# Patient Record
Sex: Female | Born: 1950
Health system: Southern US, Community
[De-identification: ages and names within clinical notes are randomized; demographics above are authoritative.]

## PROBLEM LIST (undated history)

## (undated) DIAGNOSIS — R7303 Prediabetes: Secondary | ICD-10-CM

## (undated) DIAGNOSIS — E119 Type 2 diabetes mellitus without complications: Secondary | ICD-10-CM

## (undated) DIAGNOSIS — Z780 Asymptomatic menopausal state: Secondary | ICD-10-CM

## (undated) DIAGNOSIS — Z9289 Personal history of other medical treatment: Secondary | ICD-10-CM

## (undated) DIAGNOSIS — Z8679 Personal history of other diseases of the circulatory system: Secondary | ICD-10-CM

## (undated) DIAGNOSIS — E7801 Familial hypercholesterolemia: Secondary | ICD-10-CM

## (undated) DIAGNOSIS — I10 Essential (primary) hypertension: Secondary | ICD-10-CM

## (undated) DIAGNOSIS — M8580 Other specified disorders of bone density and structure, unspecified site: Secondary | ICD-10-CM

## (undated) DIAGNOSIS — S83207D Unspecified tear of unspecified meniscus, current injury, left knee, subsequent encounter: Secondary | ICD-10-CM

## (undated) DIAGNOSIS — M810 Age-related osteoporosis without current pathological fracture: Secondary | ICD-10-CM

## (undated) HISTORY — DX: Type 2 diabetes mellitus without complications: E11.9

## (undated) HISTORY — DX: Unspecified tear of unspecified meniscus, current injury, left knee, subsequent encounter: S83.207D

## (undated) HISTORY — DX: Prediabetes: R73.03

## (undated) HISTORY — DX: Personal history of other medical treatment: Z92.89

## (undated) HISTORY — DX: Personal history of other diseases of the circulatory system: Z86.79

## (undated) HISTORY — DX: Other specified disorders of bone density and structure, unspecified site: M85.80

## (undated) HISTORY — DX: Asymptomatic menopausal state: Z78.0

## (undated) HISTORY — DX: Essential (primary) hypertension: I10

## (undated) HISTORY — DX: Familial hypercholesterolemia: E78.01

## (undated) HISTORY — PX: TUBAL LIGATION: SHX77

---
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
## 1997-12-17 HISTORY — PX: ABDOMINAL HYSTERECTOMY: SHX81

## 2016-04-24 ENCOUNTER — Ambulatory Visit: Payer: BLUE CROSS/BLUE SHIELD | Admitting: Nurse Practitioner

## 2016-04-27 ENCOUNTER — Encounter: Payer: Self-pay | Admitting: Family Medicine

## 2016-04-27 ENCOUNTER — Ambulatory Visit (INDEPENDENT_AMBULATORY_CARE_PROVIDER_SITE_OTHER): Payer: BLUE CROSS/BLUE SHIELD | Admitting: Family Medicine

## 2016-04-27 VITALS — BP 140/92 | HR 83 | Temp 98.4°F | Ht 62.5 in | Wt 157.4 lb

## 2016-04-27 DIAGNOSIS — R6889 Other general symptoms and signs: Secondary | ICD-10-CM | POA: Diagnosis not present

## 2016-04-27 DIAGNOSIS — I10 Essential (primary) hypertension: Secondary | ICD-10-CM | POA: Diagnosis not present

## 2016-04-27 DIAGNOSIS — Z0001 Encounter for general adult medical examination with abnormal findings: Secondary | ICD-10-CM | POA: Diagnosis not present

## 2016-04-27 NOTE — Patient Instructions (Signed)
It was great to see you today.  Please give [place] a call about your BP medication and we will make adjustments.  Schedule a follow up lab appt on your way out.  Be sure to get your mammogram later this year.  We will discuss follow-up once I hear back about your blood pressure medication  Take care  Dr. Adriana Simas  Health Maintenance, Female Adopting a healthy lifestyle and getting preventive care can go a long way to promote health and wellness. Talk with your health care provider about what schedule of regular examinations is right for you. This is a good chance for you to check in with your provider about disease prevention and staying healthy. In between checkups, there are plenty of things you can do on your own. Experts have done a lot of research about which lifestyle changes and preventive measures are most likely to keep you healthy. Ask your health care provider for more information. WEIGHT AND DIET  Eat a healthy diet  Be sure to include plenty of vegetables, fruits, low-fat dairy products, and lean protein.  Do not eat a lot of foods high in solid fats, added sugars, or salt.  Get regular exercise. This is one of the most important things you can do for your health.  Most adults should exercise for at least 150 minutes each week. The exercise should increase your heart rate and make you sweat (moderate-intensity exercise).  Most adults should also do strengthening exercises at least twice a week. This is in addition to the moderate-intensity exercise.  Maintain a healthy weight  Body mass index (BMI) is a measurement that can be used to identify possible weight problems. It estimates body fat based on height and weight. Your health care provider can help determine your BMI and help you achieve or maintain a healthy weight.  For females 16 years of age and older:   A BMI below 18.5 is considered underweight.  A BMI of 18.5 to 24.9 is normal.  A BMI of 25 to 29.9 is  considered overweight.  A BMI of 30 and above is considered obese.  Watch levels of cholesterol and blood lipids  You should start having your blood tested for lipids and cholesterol at 63 years of age, then have this test every 5 years.  You may need to have your cholesterol levels checked more often if:  Your lipid or cholesterol levels are high.  You are older than 63 years of age.  You are at high risk for heart disease.  CANCER SCREENING   Lung Cancer  Lung cancer screening is recommended for adults 69-8 years old who are at high risk for lung cancer because of a history of smoking.  A yearly low-dose CT scan of the lungs is recommended for people who:  Currently smoke.  Have quit within the past 15 years.  Have at least a 30-pack-year history of smoking. A pack year is smoking an average of one pack of cigarettes a day for 1 year.  Yearly screening should continue until it has been 15 years since you quit.  Yearly screening should stop if you develop a health problem that would prevent you from having lung cancer treatment.  Breast Cancer  Practice breast self-awareness. This means understanding how your breasts normally appear and feel.  It also means doing regular breast self-exams. Let your health care provider know about any changes, no matter how small.  If you are in your 20s or 30s, you should  have a clinical breast exam (CBE) by a health care provider every 1-3 years as part of a regular health exam.  If you are 45 or older, have a CBE every year. Also consider having a breast X-ray (mammogram) every year.  If you have a family history of breast cancer, talk to your health care provider about genetic screening.  If you are at high risk for breast cancer, talk to your health care provider about having an MRI and a mammogram every year.  Breast cancer gene (BRCA) assessment is recommended for women who have family members with BRCA-related cancers.  BRCA-related cancers include:  Breast.  Ovarian.  Tubal.  Peritoneal cancers.  Results of the assessment will determine the need for genetic counseling and BRCA1 and BRCA2 testing. Cervical Cancer Your health care provider may recommend that you be screened regularly for cancer of the pelvic organs (ovaries, uterus, and vagina). This screening involves a pelvic examination, including checking for microscopic changes to the surface of your cervix (Pap test). You may be encouraged to have this screening done every 3 years, beginning at age 6.  For women ages 61-65, health care providers may recommend pelvic exams and Pap testing every 3 years, or they may recommend the Pap and pelvic exam, combined with testing for human papilloma virus (HPV), every 5 years. Some types of HPV increase your risk of cervical cancer. Testing for HPV may also be done on women of any age with unclear Pap test results.  Other health care providers may not recommend any screening for nonpregnant women who are considered low risk for pelvic cancer and who do not have symptoms. Ask your health care provider if a screening pelvic exam is right for you.  If you have had past treatment for cervical cancer or a condition that could lead to cancer, you need Pap tests and screening for cancer for at least 20 years after your treatment. If Pap tests have been discontinued, your risk factors (such as having a new sexual partner) need to be reassessed to determine if screening should resume. Some women have medical problems that increase the chance of getting cervical cancer. In these cases, your health care provider may recommend more frequent screening and Pap tests. Colorectal Cancer  This type of cancer can be detected and often prevented.  Routine colorectal cancer screening usually begins at 63 years of age and continues through 63 years of age.  Your health care provider may recommend screening at an earlier age if you  have risk factors for colon cancer.  Your health care provider may also recommend using home test kits to check for hidden blood in the stool.  A small camera at the end of a tube can be used to examine your colon directly (sigmoidoscopy or colonoscopy). This is done to check for the earliest forms of colorectal cancer.  Routine screening usually begins at age 68.  Direct examination of the colon should be repeated every 5-10 years through 63 years of age. However, you may need to be screened more often if early forms of precancerous polyps or small growths are found. Skin Cancer  Check your skin from head to toe regularly.  Tell your health care provider about any new moles or changes in moles, especially if there is a change in a mole's shape or color.  Also tell your health care provider if you have a mole that is larger than the size of a pencil eraser.  Always use sunscreen. Apply  sunscreen liberally and repeatedly throughout the day.  Protect yourself by wearing long sleeves, pants, a wide-brimmed hat, and sunglasses whenever you are outside. HEART DISEASE, DIABETES, AND HIGH BLOOD PRESSURE   High blood pressure causes heart disease and increases the risk of stroke. High blood pressure is more likely to develop in:  People who have blood pressure in the high end of the normal range (130-139/85-89 mm Hg).  People who are overweight or obese.  People who are African American.  If you are 87-106 years of age, have your blood pressure checked every 3-5 years. If you are 77 years of age or older, have your blood pressure checked every year. You should have your blood pressure measured twice--once when you are at a hospital or clinic, and once when you are not at a hospital or clinic. Record the average of the two measurements. To check your blood pressure when you are not at a hospital or clinic, you can use:  An automated blood pressure machine at a pharmacy.  A home blood pressure  monitor.  If you are between 20 years and 76 years old, ask your health care provider if you should take aspirin to prevent strokes.  Have regular diabetes screenings. This involves taking a blood sample to check your fasting blood sugar level.  If you are at a normal weight and have a low risk for diabetes, have this test once every three years after 62 years of age.  If you are overweight and have a high risk for diabetes, consider being tested at a younger age or more often. PREVENTING INFECTION  Hepatitis B  If you have a higher risk for hepatitis B, you should be screened for this virus. You are considered at high risk for hepatitis B if:  You were born in a country where hepatitis B is common. Ask your health care provider which countries are considered high risk.  Your parents were born in a high-risk country, and you have not been immunized against hepatitis B (hepatitis B vaccine).  You have HIV or AIDS.  You use needles to inject street drugs.  You live with someone who has hepatitis B.  You have had sex with someone who has hepatitis B.  You get hemodialysis treatment.  You take certain medicines for conditions, including cancer, organ transplantation, and autoimmune conditions. Hepatitis C  Blood testing is recommended for:  Everyone born from [birth year] through 1965.  Anyone with known risk factors for hepatitis C. Sexually transmitted infections (STIs)  You should be screened for sexually transmitted infections (STIs) including gonorrhea and chlamydia if:  You are sexually active and are younger than 63 years of age.  You are older than 63 years of age and your health care provider tells you that you are at risk for this type of infection.  Your sexual activity has changed since you were last screened and you are at an increased risk for chlamydia or gonorrhea. Ask your health care provider if you are at risk.  If you do not have HIV, but are at risk, it may be  recommended that you take a prescription medicine daily to prevent HIV infection. This is called pre-exposure prophylaxis (PrEP). You are considered at risk if:  You are sexually active and do not regularly use condoms or know the HIV status of your partner(s).  You take drugs by injection.  You are sexually active with a partner who has HIV. Talk with your health care provider about whether  you are at high risk of being infected with HIV. If you choose to begin PrEP, you should first be tested for HIV. You should then be tested every 3 months for as long as you are taking PrEP.  PREGNANCY   If you are premenopausal and you may become pregnant, ask your health care provider about preconception counseling.  If you may become pregnant, take 400 to 800 micrograms (mcg) of folic acid every day.  If you want to prevent pregnancy, talk to your health care provider about birth control (contraception). OSTEOPOROSIS AND MENOPAUSE   Osteoporosis is a disease in which the bones lose minerals and strength with aging. This can result in serious bone fractures. Your risk for osteoporosis can be identified using a bone density scan.  If you are 25 years of age or older, or if you are at risk for osteoporosis and fractures, ask your health care provider if you should be screened.  Ask your health care provider whether you should take a calcium or vitamin D supplement to lower your risk for osteoporosis.  Menopause may have certain physical symptoms and risks.  Hormone replacement therapy may reduce some of these symptoms and risks. Talk to your health care provider about whether hormone replacement therapy is right for you.  HOME CARE INSTRUCTIONS   Schedule regular health, dental, and eye exams.  Stay current with your immunizations.   Do not use any tobacco products including cigarettes, chewing tobacco, or electronic cigarettes.  If you are pregnant, do not drink alcohol.  If you are  breastfeeding, limit how much and how often you drink alcohol.  Limit alcohol intake to no more than 1 drink per day for nonpregnant women. One drink equals 12 ounces of beer, 5 ounces of wine, or 1 ounces of hard liquor.  Do not use street drugs.  Do not share needles.  Ask your health care provider for help if you need support or information about quitting drugs.  Tell your health care provider if you often feel depressed.  Tell your health care provider if you have ever been abused or do not feel safe at home.   This information is not intended to replace advice given to you by your health care provider. Make sure you discuss any questions you have with your health care provider.   Document Released: 06/18/2011 Document Revised: 12/24/2014 Document Reviewed: 11/04/2013 Elsevier Interactive Patient Education [employer].

## 2016-04-27 NOTE — Progress Notes (Signed)
Pre visit review using our clinic review tool, if applicable. No additional management support is needed unless otherwise documented below in the visit note. 

## 2016-04-28 ENCOUNTER — Encounter: Payer: Self-pay | Admitting: Family Medicine

## 2016-04-28 DIAGNOSIS — I10 Essential (primary) hypertension: Secondary | ICD-10-CM | POA: Insufficient documentation

## 2016-04-28 DIAGNOSIS — Z0001 Encounter for general adult medical examination with abnormal findings: Secondary | ICD-10-CM | POA: Insufficient documentation

## 2016-04-28 HISTORY — DX: Essential (primary) hypertension: I10

## 2016-04-28 NOTE — Assessment & Plan Note (Signed)
Stable and right at goal. Patient to call us to inform of correct medication. Will plan to cut back to single drug to see how she will do. Advised healthy low salt diet and weight loss.

## 2016-04-28 NOTE — Progress Notes (Signed)
Subjective:  Patient ID: Christine Cruz, female    DOB: [birth year]  Age: 65 y.o. MRN: [id number]  CC: Establish care, HTN  HPI Christine Cruz is a 65 y.o. female presents to the clinic today to establish care.  Preventative Healthcare  Pap smear: Up to date. 2016.  Mammogram: Up to date. 2016.  Colonoscopy: Up to date. Done earlier in 2017.  Immunizations  Tetanus - In need of. Would like to wait on this at this time.  Pneumococcal - N/A.  Flu - Not indicated at this time of the year.  Zoster - Due for. Would like to wait.  Labs: Patient to return for fasting labs (per patient preference).  Exercise: No regular exercise.  Alcohol use: See below.  Smoking/tobacco use: Former smoker.  Regular dental exams: No.  Wears seat belt: Yes.  HTN  Stable.  Patient states that she is on HCTZ and another medication (she is unsure if it is lisinopril or losartan.  She states that she has been stable but feels that her prior medication (Benicar) did a better job.  She expresses desire to decrease and ultimately come off medication if she can.  She would like to discuss how to do this today.  PMH, Surgical Hx, Family Hx, Social History reviewed and updated as below.  Past Medical History  Diagnosis Date  . History of hypertension   . History of blood transfusion    Past Surgical History  Procedure Laterality Date  . Breast biopsy  1966  . Tonsillectomy  1965  . Abdominal hysterectomy  1999   Family History  Problem Relation Age of Onset  . Arthritis Mother   . Breast cancer Sister   . Heart disease Mother   . Stroke Mother   . Hypertension Mother   . Kidney disease Mother   . Diabetes Mother    Social History  Substance Use Topics  . Smoking status: Former Smoker    Quit date: 04/27/2006  . Smokeless tobacco: Not on file  . Alcohol Use: 0.0 oz/week    0 Standard drinks or equivalent per week     Comment: occ   Review of Systems General: Denies unexplained  weight loss, fever. Skin: Denies new or changing mole, sore/wound that won't heal. ENT: Trouble hearing, ringing in the ears, sores in the mouth, hoarseness, trouble swallowing. Eyes: Denies trouble seeing/visual disturbance. Heart/CV: Denies chest pain, shortness of breath, edema, palpitations. Lungs/Resp: Denies cough, shortness of breath, hemoptysis. Abd/GI: Denies nausea, vomiting, diarrhea, constipation, abdominal pain, hematochezia, melena. GU: Denies dysuria, incontinence, hematuria, urinary frequency, difficulty starting/keeping stream, vaginal discharge, sexual difficulty, lump in breasts. MSK: Denies joint pain/swelling, myalgias. Neuro: Denies headaches, weakness, numbness, dizziness, syncope. Psych: Denies sadness, anxiety, stress, memory difficulty. Endocrine: Denies polyuria and polydipsia.  Objective:   Today's Vitals: BP 140/92 mmHg  Pulse 83  Temp(Src) 98.4 F (36.9 C) (Oral)  Ht 5' 2.5" (1.588 m)  Wt 157 lb 6 oz (71.385 kg)  BMI 28.31 kg/m2  SpO2 97%  Physical Exam  Constitutional: She is oriented to person, place, and time. She appears well-developed and well-nourished. No distress.  HENT:  Head: Normocephalic and atraumatic.  Nose: Nose normal.  Mouth/Throat: Oropharynx is clear and moist. No oropharyngeal exudate.  Normal TM's bilaterally.   Eyes: Conjunctivae are normal. No scleral icterus.  Neck: Neck supple. No thyromegaly present.  Cardiovascular: Normal rate and regular rhythm.   No murmur heard. Pulmonary/Chest: Effort normal and breath sounds normal. She has no wheezes. She  has no rales.  Abdominal: Soft. She exhibits no distension. There is no tenderness. There is no rebound and no guarding.  Musculoskeletal: Normal range of motion. She exhibits no edema.  Lymphadenopathy:    She has no cervical adenopathy.  Neurological: She is alert and oriented to person, place, and time.  Skin: Skin is warm and dry. No rash noted.  Psychiatric: She has a  normal mood and affect.  Vitals reviewed.  Assessment & Plan:   Problem List Items Addressed This Visit    Essential hypertension    Stable and right at goal. Patient to call us to inform of correct medication. Will plan to cut back to single drug to see how she will do. Advised healthy low salt diet and weight loss.       Relevant Medications   losartan-hydrochlorothiazide (HYZAAR) 50-12.5 MG tablet   Encounter for preventative adult health care exam with abnormal findings - Primary    Patient to return for labs. Waiting on Tdap.  Pap smear and mammogram up to date. Colonoscopy up to date.         Outpatient Encounter Prescriptions as of 04/27/2016  Medication Sig  . CALCIUM CARBONATE PO Take 1 capsule by mouth daily.  Marland Kitchen losartan-hydrochlorothiazide (HYZAAR) 50-12.5 MG tablet Take 1 tablet by mouth daily.  . Omega-3 Fatty Acids (FISH OIL PO) Take 1 capsule by mouth daily.   No facility-administered encounter medications on file as of 04/27/2016.    Follow-up: Return for 1-2 weeks for Labs.  [place]

## 2016-04-28 NOTE — Assessment & Plan Note (Signed)
Patient to return for labs. Waiting on Tdap.  Pap smear and mammogram up to date. Colonoscopy up to date.

## 2016-04-30 ENCOUNTER — Other Ambulatory Visit: Payer: Self-pay | Admitting: Family Medicine

## 2016-04-30 ENCOUNTER — Telehealth: Payer: Self-pay | Admitting: Family Medicine

## 2016-04-30 MED ORDER — LOSARTAN POTASSIUM 100 MG PO TABS: 100.0000 mg | ORAL_TABLET | Freq: Every day | ORAL | Status: DC

## 2016-04-30 NOTE — Telephone Encounter (Signed)
Pt states she was told to clarify the medication of Losartan HCTZ. 50-12.5 mg. Thank you!

## 2016-04-30 NOTE — Telephone Encounter (Signed)
Patient was seen on 5/12 to establish care, she was told to respond back with what her BP meds where,  They are Losartan/HCTZ 50/12.5mg .  Please advise. thanks

## 2016-04-30 NOTE — Telephone Encounter (Signed)
Will change to Losartan. Rx sent. Needs labs in 7-10 days.

## 2016-05-01 NOTE — Telephone Encounter (Signed)
A voicemail was left to callback.

## 2016-05-01 NOTE — Telephone Encounter (Signed)
Patient called back told about her new medication. Patient gave verbal understanding.

## 2016-05-07 ENCOUNTER — Other Ambulatory Visit: Payer: Self-pay | Admitting: Family Medicine

## 2016-05-07 ENCOUNTER — Telehealth: Payer: Self-pay | Admitting: *Deleted

## 2016-05-07 ENCOUNTER — Other Ambulatory Visit (INDEPENDENT_AMBULATORY_CARE_PROVIDER_SITE_OTHER): Payer: BLUE CROSS/BLUE SHIELD

## 2016-05-07 DIAGNOSIS — Z130 Encounter for screening for diseases of the blood and blood-forming organs and certain disorders involving the immune mechanism: Secondary | ICD-10-CM | POA: Diagnosis not present

## 2016-05-07 DIAGNOSIS — E663 Overweight: Secondary | ICD-10-CM

## 2016-05-07 DIAGNOSIS — I10 Essential (primary) hypertension: Secondary | ICD-10-CM

## 2016-05-07 LAB — LIPID PANEL
Cholesterol: 258 mg/dL — ABNORMAL HIGH (ref 0–200)
HDL: 60 mg/dL (ref >39.00)
LDL CALC: 169 mg/dL — AB (ref 0–99)
NonHDL: 197.62
Total CHOL/HDL Ratio: 4
Triglycerides: 145 mg/dL (ref 0.0–149.0)
VLDL: 29 mg/dL (ref 0.0–40.0)

## 2016-05-07 LAB — CBC
HCT: 41.6 % (ref 36.0–46.0)
HEMOGLOBIN: 13.8 g/dL (ref 12.0–15.0)
MCHC: 33.2 g/dL (ref 30.0–36.0)
MCV: 89.5 fl (ref 78.0–100.0)
PLATELETS: 186 10*3/uL (ref 150.0–400.0)
RBC: 4.65 Mil/uL (ref 3.87–5.11)
RDW: 13.5 % (ref 11.5–15.5)
WBC: 5.4 10*3/uL (ref 4.0–10.5)

## 2016-05-07 LAB — COMPREHENSIVE METABOLIC PANEL
ALT: 15 U/L (ref 0–35)
AST: 16 U/L (ref 0–37)
Albumin: 4.4 g/dL (ref 3.5–5.2)
Alkaline Phosphatase: 57 U/L (ref 39–117)
BUN: 10 mg/dL (ref 6–23)
CALCIUM: 9.9 mg/dL (ref 8.4–10.5)
CHLORIDE: 102 meq/L (ref 96–112)
CO2: 30 meq/L (ref 19–32)
Creatinine, Ser: 0.79 mg/dL (ref 0.40–1.20)
GFR: 78.14 mL/min (ref >60.00)
Glucose, Bld: 119 mg/dL — ABNORMAL HIGH (ref 70–99)
Potassium: 3.9 mEq/L (ref 3.5–5.1)
Sodium: 139 mEq/L (ref 135–145)
Total Bilirubin: 0.8 mg/dL (ref 0.2–1.2)
Total Protein: 6.7 g/dL (ref 6.0–8.3)

## 2016-05-07 LAB — HEMOGLOBIN A1C: HEMOGLOBIN A1C: 5.7 % (ref 4.6–6.5)

## 2016-05-07 LAB — LDL CHOLESTEROL, DIRECT: LDL DIRECT: 159 mg/dL

## 2016-05-07 NOTE — Telephone Encounter (Signed)
Orders in 

## 2016-05-07 NOTE — Telephone Encounter (Signed)
Labs and dx?  

## 2016-05-08 ENCOUNTER — Other Ambulatory Visit: Payer: Self-pay | Admitting: Family Medicine

## 2016-05-08 MED ORDER — ATORVASTATIN CALCIUM 40 MG PO TABS: 40.0000 mg | ORAL_TABLET | Freq: Every day | ORAL | Status: DC

## 2017-04-26 ENCOUNTER — Other Ambulatory Visit: Payer: Self-pay | Admitting: Family Medicine

## 2017-04-26 NOTE — Telephone Encounter (Signed)
Last OV 04/27/16/18 no appmt scheduled, last filled 04/30/16 90 [id number]

## 2017-07-25 ENCOUNTER — Other Ambulatory Visit: Payer: Self-pay | Admitting: Family Medicine

## 2017-08-06 ENCOUNTER — Ambulatory Visit (INDEPENDENT_AMBULATORY_CARE_PROVIDER_SITE_OTHER): Payer: BLUE CROSS/BLUE SHIELD | Admitting: Family Medicine

## 2017-08-06 ENCOUNTER — Encounter: Payer: Self-pay | Admitting: Family Medicine

## 2017-08-06 DIAGNOSIS — I10 Essential (primary) hypertension: Secondary | ICD-10-CM | POA: Diagnosis not present

## 2017-08-06 DIAGNOSIS — H9192 Unspecified hearing loss, left ear: Secondary | ICD-10-CM

## 2017-08-06 DIAGNOSIS — M722 Plantar fascial fibromatosis: Secondary | ICD-10-CM | POA: Diagnosis not present

## 2017-08-06 DIAGNOSIS — H9190 Unspecified hearing loss, unspecified ear: Secondary | ICD-10-CM | POA: Insufficient documentation

## 2017-08-06 DIAGNOSIS — E118 Type 2 diabetes mellitus with unspecified complications: Secondary | ICD-10-CM | POA: Insufficient documentation

## 2017-08-06 DIAGNOSIS — E119 Type 2 diabetes mellitus without complications: Secondary | ICD-10-CM | POA: Insufficient documentation

## 2017-08-06 DIAGNOSIS — R7303 Prediabetes: Secondary | ICD-10-CM | POA: Insufficient documentation

## 2017-08-06 DIAGNOSIS — E1165 Type 2 diabetes mellitus with hyperglycemia: Secondary | ICD-10-CM | POA: Insufficient documentation

## 2017-08-06 DIAGNOSIS — E785 Hyperlipidemia, unspecified: Secondary | ICD-10-CM | POA: Insufficient documentation

## 2017-08-06 DIAGNOSIS — E1169 Type 2 diabetes mellitus with other specified complication: Secondary | ICD-10-CM | POA: Insufficient documentation

## 2017-08-06 MED ORDER — LOSARTAN POTASSIUM-HCTZ 100-12.5 MG PO TABS: 1.0000 | ORAL_TABLET | Freq: Every day | ORAL | 3 refills | Status: DC

## 2017-08-06 NOTE — Patient Instructions (Signed)
Take the medication daily.  Heel inserts for the shoes. Holds are 3-5 seconds. Complete each exercise 10 times daily.   We will call with the referral.  Take care  Follow up in 3 months  Dr. Lacinda Axon   Plantar Fasciitis Rehab Ask your health care provider which exercises are safe for you. Do exercises exactly as told by your health care provider and adjust them as directed. It is normal to feel mild stretching, pulling, tightness, or discomfort as you do these exercises, but you should stop right away if you feel sudden pain or your pain gets worse. Do not begin these exercises until told by your health care provider. Stretching and range of motion exercises These exercises warm up your muscles and joints and improve the movement and flexibility of your foot. These exercises also help to relieve pain. Exercise A: Plantar fascia stretch  1. Sit with your left / right leg crossed over your opposite knee. 2. Hold your heel with one hand with that thumb near your arch. With your other hand, hold your toes and gently pull them back toward the top of your foot. You should feel a stretch on the bottom of your toes or your foot or both. 3. Hold this stretch for__________ seconds. 4. Slowly release your toes and return to the starting position. Repeat __________ times. Complete this exercise __________ times a day. Exercise B: Gastroc, standing  1. Stand with your hands against a wall. 2. Extend your left / right leg behind you, and bend your front knee slightly. 3. Keeping your heels on the floor and keeping your back knee straight, shift your weight toward the wall without arching your back. You should feel a gentle stretch in your left / right calf. 4. Hold this position for __________ seconds. Repeat __________ times. Complete this exercise __________ times a day. Exercise C: Soleus, standing 1. Stand with your hands against a wall. 2. Extend your left / right leg behind you, and bend your  front knee slightly. 3. Keeping your heels on the floor, bend your back knee and slightly shift your weight over the back leg. You should feel a gentle stretch deep in your calf. 4. Hold this position for __________ seconds. Repeat __________ times. Complete this exercise __________ times a day. Exercise D: Gastrocsoleus, standing 1. Stand with the ball of your left / right foot on a step. The ball of your foot is on the walking surface, right under your toes. 2. Keep your other foot firmly on the same step. 3. Hold onto the wall or a railing for balance. 4. Slowly lift your other foot, allowing your body weight to press your heel down over the edge of the step. You should feel a stretch in your left / right calf. 5. Hold this position for __________ seconds. 6. Return both feet to the step. 7. Repeat this exercise with a slight bend in your left / right knee. Repeat __________ times with your left / right knee straight and __________ times with your left / right knee bent. Complete this exercise __________ times a day. Balance exercise This exercise builds your balance and strength control of your arch to help take pressure off your plantar fascia. Exercise E: Single leg stand 1. Without shoes, stand near a railing or in a doorway. You may hold onto the railing or door frame as needed. 2. Stand on your left / right foot. Keep your big toe down on the floor and try to keep  your arch lifted. Do not let your foot roll inward. 3. Hold this position for __________ seconds. 4. If this exercise is too easy, you can try it with your eyes closed or while standing on a pillow. Repeat __________ times. Complete this exercise __________ times a day. This information is not intended to replace advice given to you by your health care provider. Make sure you discuss any questions you have with your health care provider. Document Released: 12/03/2005 Document Revised: 08/07/2016 Document Reviewed:  10/17/2015 Elsevier Interactive Patient Education  2018 [employer].

## 2017-08-06 NOTE — Assessment & Plan Note (Signed)
Uncontrolled. Adding HCTZ.

## 2017-08-06 NOTE — Assessment & Plan Note (Signed)
New problem. Uncertain etiology. Referring to ENT.

## 2017-08-06 NOTE — Assessment & Plan Note (Signed)
New problem. Advised heel cups, OTC analgesics. Exercises given.

## 2017-08-06 NOTE — Progress Notes (Signed)
Subjective:  Patient ID: Christine Cruz, female    DOB: 10/29/1951  Age: 66 y.o. MRN: [id number]  CC: Elevated BP, hearing loss, foot pain  HPI:  66 year old female with hypertension, hyperlipidemia, prediabetes presents with the above complaints.  Hypertension  Patient states that her blood pressure has been elevated. She states she has difficulty keeping her pressures down below 140/90.  She is currently on losartan 100 mg daily.  Hearing loss  Decreased hearing in the left ear.  Has been going on for months.  No reports of pain.  No other associated symptoms.  She does not recall any traumatic event. No recent infection.  Foot pain  Right foot.  8 months.  Started after increase in activity.  Pain is located on the heel.  Some improvement with massage.   Worse with activity.  No reported trauma or fall.   Social Hx   Social History   Social History  . Marital status: Married    Spouse name: N/A  . Number of children: N/A  . Years of education: N/A   Social History Main Topics  . Smoking status: Former Smoker    Quit date: 04/27/2006  . Smokeless tobacco: Not on file  . Alcohol use 0.0 oz/week     Comment: occ  . Drug use: No  . Sexual activity: Not on file   Other Topics Concern  . Not on file   Social History Narrative  . No narrative on file    Review of Systems  HENT: Positive for hearing loss.   Musculoskeletal:       Foot pain.   Objective:  BP 138/90 (BP Location: Left Arm, Patient Position: Sitting, Cuff Size: Normal)   Pulse 80   Temp 98.2 F (36.8 C) (Oral)   Resp 16   Wt 162 lb 6 oz (73.7 kg)   SpO2 98%   BMI 29.23 kg/m   BP/Weight 08/06/2017 1/88/4166  Systolic BP 063 016  Diastolic BP 90 92  Wt. (Lbs) 162.38 157.38  BMI 29.23 28.31    Physical Exam  Constitutional: She is oriented to person, place, and time. She appears well-developed. No distress.  HENT:  Head: Normocephalic and atraumatic.  L ear - no cerumen  impaction. Effusion noted.   Cardiovascular: Normal rate and regular rhythm.   No murmur heard. Pulmonary/Chest: Effort normal. She has no wheezes. She has no rales.  Musculoskeletal:  Right foot - mild tenderness at the medial heel, at the attachment site of the plantar fascia.  Neurological: She is alert and oriented to person, place, and time.  Psychiatric: She has a normal mood and affect.  Vitals reviewed.   Lab Results  Component Value Date   WBC 5.4 05/07/2016   HGB 13.8 05/07/2016   HCT 41.6 05/07/2016   PLT 186.0 05/07/2016   GLUCOSE 119 (H) 05/07/2016   CHOL 258 (H) 05/07/2016   TRIG 145.0 05/07/2016   HDL 60.00 05/07/2016   LDLDIRECT 159.0 05/07/2016   LDLCALC 169 (H) 05/07/2016   ALT 15 05/07/2016   AST 16 05/07/2016   NA 139 05/07/2016   K 3.9 05/07/2016   CL 102 05/07/2016   CREATININE 0.79 05/07/2016   BUN 10 05/07/2016   CO2 30 05/07/2016   HGBA1C 5.7 05/07/2016    Assessment & Plan:   Problem List Items Addressed This Visit    Essential hypertension    Uncontrolled. Adding HCTZ.      Relevant Medications   losartan-hydrochlorothiazide (HYZAAR) 100-12.5 MG  tablet   Hearing loss    New problem. Uncertain etiology. Referring to ENT.      Relevant Orders   Ambulatory referral to ENT   Plantar fasciitis    New problem. Advised heel cups, OTC analgesics. Exercises given.         Meds ordered this encounter  Medications  . CALCIUM-MAGNESIUM PO    Sig: Take by mouth daily.  Marland Kitchen losartan-hydrochlorothiazide (HYZAAR) 100-12.5 MG tablet    Sig: Take 1 tablet by mouth daily.    Dispense:  90 tablet    Refill:  3     Follow-up: 3 months  Mar-Mac DO [facility]

## 2017-08-23 ENCOUNTER — Other Ambulatory Visit: Payer: Self-pay | Admitting: Otolaryngology

## 2017-08-23 DIAGNOSIS — H90A22 Sensorineural hearing loss, unilateral, left ear, with restricted hearing on the contralateral side: Secondary | ICD-10-CM

## 2017-08-30 ENCOUNTER — Ambulatory Visit: Payer: BLUE CROSS/BLUE SHIELD

## 2017-09-10 ENCOUNTER — Ambulatory Visit: Payer: BLUE CROSS/BLUE SHIELD

## 2017-10-17 ENCOUNTER — Other Ambulatory Visit: Payer: Self-pay | Admitting: Family Medicine

## 2018-01-08 ENCOUNTER — Other Ambulatory Visit: Payer: Self-pay | Admitting: Family Medicine

## 2018-01-09 NOTE — Telephone Encounter (Signed)
I have scheduled patient an appointment to establish care and informed patient to be fasting, last OV 08/06/17 with Dr.Cook last filled 10/17/17 90 0rf

## 2018-01-30 ENCOUNTER — Ambulatory Visit: Payer: BLUE CROSS/BLUE SHIELD | Admitting: Internal Medicine

## 2018-01-30 ENCOUNTER — Encounter: Payer: Self-pay | Admitting: Internal Medicine

## 2018-01-30 VITALS — BP 138/92 | HR 81 | Temp 97.7°F | Ht 61.0 in | Wt 159.2 lb

## 2018-01-30 DIAGNOSIS — Z1231 Encounter for screening mammogram for malignant neoplasm of breast: Secondary | ICD-10-CM | POA: Diagnosis not present

## 2018-01-30 DIAGNOSIS — R7303 Prediabetes: Secondary | ICD-10-CM

## 2018-01-30 DIAGNOSIS — K635 Polyp of colon: Secondary | ICD-10-CM | POA: Diagnosis not present

## 2018-01-30 DIAGNOSIS — E785 Hyperlipidemia, unspecified: Secondary | ICD-10-CM

## 2018-01-30 DIAGNOSIS — I10 Essential (primary) hypertension: Secondary | ICD-10-CM | POA: Diagnosis not present

## 2018-01-30 DIAGNOSIS — Z1329 Encounter for screening for other suspected endocrine disorder: Secondary | ICD-10-CM | POA: Diagnosis not present

## 2018-01-30 LAB — CBC WITH DIFFERENTIAL/PLATELET
BASOS PCT: 0.3 % (ref 0.0–3.0)
Basophils Absolute: 0 10*3/uL (ref 0.0–0.1)
Eosinophils Absolute: 0.1 10*3/uL (ref 0.0–0.7)
Eosinophils Relative: 2 % (ref 0.0–5.0)
HEMATOCRIT: 43 % (ref 36.0–46.0)
Hemoglobin: 14.5 g/dL (ref 12.0–15.0)
LYMPHS ABS: 1.6 10*3/uL (ref 0.7–4.0)
Lymphocytes Relative: 29 % (ref 12.0–46.0)
MCHC: 33.7 g/dL (ref 30.0–36.0)
MCV: 91.7 fl (ref 78.0–100.0)
MONOS PCT: 8.7 % (ref 3.0–12.0)
Monocytes Absolute: 0.5 10*3/uL (ref 0.1–1.0)
NEUTROS ABS: 3.3 10*3/uL (ref 1.4–7.7)
NEUTROS PCT: 60 % (ref 43.0–77.0)
PLATELETS: 186 10*3/uL (ref 150.0–400.0)
RBC: 4.69 Mil/uL (ref 3.87–5.11)
RDW: 13.2 % (ref 11.5–15.5)
WBC: 5.4 10*3/uL (ref 4.0–10.5)

## 2018-01-30 LAB — COMPREHENSIVE METABOLIC PANEL
ALT: 17 U/L (ref 0–35)
AST: 18 U/L (ref 0–37)
Albumin: 4.7 g/dL (ref 3.5–5.2)
Alkaline Phosphatase: 62 U/L (ref 39–117)
BILIRUBIN TOTAL: 0.9 mg/dL (ref 0.2–1.2)
BUN: 12 mg/dL (ref 6–23)
CALCIUM: 10 mg/dL (ref 8.4–10.5)
CHLORIDE: 103 meq/L (ref 96–112)
CO2: 32 meq/L (ref 19–32)
CREATININE: 0.88 mg/dL (ref 0.40–1.20)
GFR: 68.62 mL/min (ref >60.00)
GLUCOSE: 138 mg/dL — AB (ref 70–99)
Potassium: 3.4 mEq/L — ABNORMAL LOW (ref 3.5–5.1)
Sodium: 141 mEq/L (ref 135–145)
Total Protein: 7.5 g/dL (ref 6.0–8.3)

## 2018-01-30 LAB — LIPID PANEL
CHOL/HDL RATIO: 2
Cholesterol: 150 mg/dL (ref 0–200)
HDL: 64.9 mg/dL (ref >39.00)
LDL CALC: 68 mg/dL (ref 0–99)
NonHDL: 85.54
TRIGLYCERIDES: 87 mg/dL (ref 0.0–149.0)
VLDL: 17.4 mg/dL (ref 0.0–40.0)

## 2018-01-30 LAB — URINALYSIS, ROUTINE W REFLEX MICROSCOPIC
Bilirubin Urine: NEGATIVE
Hgb urine dipstick: NEGATIVE
Ketones, ur: NEGATIVE
Leukocytes, UA: NEGATIVE
Nitrite: NEGATIVE
PH: 6.5 (ref 5.0–8.0)
RBC / HPF: NONE SEEN (ref 0–?)
SPECIFIC GRAVITY, URINE: 1.01 (ref 1.000–1.030)
TOTAL PROTEIN, URINE-UPE24: NEGATIVE
URINE GLUCOSE: NEGATIVE
Urobilinogen, UA: 0.2 (ref 0.0–1.0)

## 2018-01-30 LAB — TSH: TSH: 1.59 u[IU]/mL (ref 0.35–4.50)

## 2018-01-30 LAB — HEMOGLOBIN A1C: Hgb A1c MFr Bld: 6.3 % (ref 4.6–6.5)

## 2018-01-30 NOTE — Progress Notes (Signed)
Chief Complaint  Patient presents with  . Follow-up    transfer from Dr. Lacinda Axon   Follow up  1. HTN uncontrolled sl on Hyzaar 100-12.5 pt checking at home as well and dpb >90. She denies increased salt intake     Review of Systems  Constitutional: Positive for weight loss.  HENT: Negative for hearing loss.   Eyes: Negative for blurred vision.  Respiratory: Negative for shortness of breath.   Cardiovascular: Negative for chest pain.  Gastrointestinal: Negative for abdominal pain.  Musculoskeletal: Negative for falls.  Skin: Negative for rash.  Neurological: Negative for headaches.  Psychiatric/Behavioral: Negative for memory loss.   Past Medical History:  Diagnosis Date  . Essential familial hypercholesterolemia   . History of blood transfusion   . History of hypertension   . Hypertension   . Menopause   . Osteoarthritis    hand  . Osteopenia    Past Surgical History:  Procedure Laterality Date  . ABDOMINAL HYSTERECTOMY  1999  . BREAST BIOPSY  1966  . TONSILLECTOMY  1965  . TUBAL LIGATION     Family History  Problem Relation Age of Onset  . Arthritis Mother   . Heart disease Mother   . Stroke Mother   . Hypertension Mother   . Kidney disease Mother   . Diabetes Mother   . Breast cancer Sister    Social History   Socioeconomic History  . Marital status: Married    Spouse name: Not on file  . Number of children: Not on file  . Years of education: Not on file  . Highest education level: Not on file  Social Needs  . Financial resource strain: Not on file  . Food insecurity - worry: Not on file  . Food insecurity - inability: Not on file  . Transportation needs - medical: Not on file  . Transportation needs - non-medical: Not on file  Occupational History  . Not on file  Tobacco Use  . Smoking status: Former Smoker    Types: Cigarettes    Last attempt to quit: 04/27/2006    Years since quitting: 11.7  . Smokeless tobacco: Never Used  . Tobacco comment:  former smoker age [age] took break 81 year 1/2 ppd max no FH lung cancer   Substance and Sexual Activity  . Alcohol use: Yes    Alcohol/week: 0.0 oz    Comment: occ  . Drug use: No  . Sexual activity: Not on file  Other Topics Concern  . Not on file  Social History Narrative   Unemployed   2 daughters 1 in [place] another [place]    Current Meds  Medication Sig  . atorvastatin (LIPITOR) 40 MG tablet Take 1 tablet (40 mg total) by mouth daily at 6 PM.  . CALCIUM-MAGNESIUM PO Take by mouth daily.  Marland Kitchen losartan-hydrochlorothiazide (HYZAAR) 100-12.5 MG tablet Take 1 tablet by mouth daily.   Allergies  Allergen Reactions  . Penicillin G Shortness Of Breath   No results found for this or any previous visit (from the past 2160 hour(s)). Objective  Body mass index is 30.08 kg/m. Wt Readings from Last 3 Encounters:  01/30/18 159 lb 3.2 oz (72.2 kg)  08/06/17 162 lb 6 oz (73.7 kg)  04/27/16 157 lb 6 oz (71.4 kg)   Temp Readings from Last 3 Encounters:  01/30/18 97.7 F (36.5 C) (Oral)  08/06/17 98.2 F (36.8 C) (Oral)  04/27/16 98.4 F (36.9 C) (Oral)  BP Readings from Last 3 Encounters:  01/30/18 (!) 138/92  08/06/17 138/90  04/27/16 (!) 140/92   Pulse Readings from Last 3 Encounters:  01/30/18 81  08/06/17 80  04/27/16 83   O2 sat room air 99%  Physical Exam  Constitutional: She is oriented to person, place, and time and well-developed, well-nourished, and in no distress.  HENT:  Head: Normocephalic and atraumatic.  Mouth/Throat: Oropharynx is clear and moist and mucous membranes are normal.  Eyes: Conjunctivae are normal. Pupils are equal, round, and reactive to light.  Cardiovascular: Normal rate, regular rhythm and normal heart sounds.  Pulmonary/Chest: Effort normal and breath sounds normal.  Abdominal: Soft. Bowel sounds are normal.  Neurological: She is alert and oriented to person, place, and time. Gait normal. Gait normal.  Skin: Skin is  warm, dry and intact.  Psychiatric: Mood, memory, affect and judgment normal.  Nursing note and vitals reviewed.   Assessment   1. HTN uncontrolled/HLD 2. Prediabetes  3. HM Plan  1. Cont meds for now f/u in 45months Check labs today  2. Disc exercise to lose already doing vegetarian  3.  Denies flu shot Tdap rec Declines Hep B/C/HIV   [facility] referred h/o 03/2016 colonospopy tubular/tubullovillous adenoma  mammo referred [place]  [facility]ap get [facility]  Disc dexa at f/u  Provider: Dr. Olivia Mackie McLean-Scocuzza-[facility] Medicine

## 2018-01-30 NOTE — Patient Instructions (Signed)
F/u in 3 months  Please sch mammogram at [facility] and GI appt with [facility] clinic referred today    DTaP Vaccine (Diphtheria, Tetanus, and Pertussis): What You Need to Know 1. Why get vaccinated? Diphtheria, tetanus, and pertussis are serious diseases caused by bacteria. Diphtheria and pertussis are spread from person to person. Tetanus enters the body through cuts or wounds. DIPHTHERIA causes a thick covering in the back of the throat.  It can lead to breathing problems, paralysis, heart failure, and even death.  TETANUS (Lockjaw) causes painful tightening of the muscles, usually all over the body.  It can lead to "locking" of the jaw so the victim cannot open his mouth or swallow. Tetanus leads to death in up to 2 out of 10 cases.  PERTUSSIS (Whooping Cough) causes coughing spells so bad that it is hard for infants to eat, drink, or breathe. These spells can last for weeks.  It can lead to pneumonia, seizures (jerking and staring spells), brain damage, and death.  Diphtheria, tetanus, and pertussis vaccine (DTaP) can help prevent these diseases. Most children who are vaccinated with DTaP will be protected throughout childhood. Many more children would get these diseases if we stopped vaccinating. DTaP is a safer version of an older vaccine called DTP. DTP is no longer used in the [place]. 2. Who should get DTaP vaccine and when? Children should get 5 doses of DTaP vaccine, one dose at each of the following ages:  2 months  4 months  6 months  15-18 months  4-6 years  DTaP may be given at the same time as other vaccines. 3. Some children should not get DTaP vaccine or should wait  Children with minor illnesses, such as a cold, may be vaccinated. But children who are moderately or severely ill should usually wait until they recover before getting DTaP vaccine.  Any child who had a life-threatening allergic reaction after a dose of DTaP should not get another dose.  Any  child who suffered a brain or nervous system disease within 7 days after a dose of DTaP should not get another dose.  Talk with your doctor if your child: ? had a seizure or collapsed after a dose of DTaP, ? cried non-stop for 3 hours or more after a dose of DTaP, ? had a fever over 105F after a dose of DTaP. Ask your doctor for more information. Some of these children should not get another dose of pertussis vaccine, but may get a vaccine without pertussis, called DT. 4. Older children and adults DTaP is not licensed for adolescents, adults, or children 31 years of age and older. But older people still need protection. A vaccine called Tdap is similar to DTaP. A single dose of Tdap is recommended for people 11 through 65 years of age. Another vaccine, called Td, protects against tetanus and diphtheria, but not pertussis. It is recommended every 10 years. There are separate Vaccine Information Statements for these vaccines. 5. What are the risks from DTaP vaccine? Getting diphtheria, tetanus, or pertussis disease is much riskier than getting DTaP vaccine. However, a vaccine, like any medicine, is capable of causing serious problems, such as severe allergic reactions. The risk of DTaP vaccine causing serious harm, or death, is extremely small. Mild problems (common)  Fever (up to about 1 child in 4)  Redness or swelling where the shot was given (up to about 1 child in 4)  Soreness or tenderness where the shot was given (up to about  1 child in 4) These problems occur more often after the 4th and 5th doses of the DTaP series than after earlier doses. Sometimes the 4th or 5th dose of DTaP vaccine is followed by swelling of the entire arm or leg in which the shot was given, lasting 1-7 days (up to about 1 child in 70). Other mild problems include:  Fussiness (up to about 1 child in 3)  Tiredness or poor appetite (up to about 1 child in 10)  Vomiting (up to about 1 child in 55) These problems  generally occur 1-3 days after the shot. Moderate problems (uncommon)  Seizure (jerking or staring) (about 1 child out of 14,000)  Non-stop crying, for 3 hours or more (up to about 1 child out of 1,000)  High fever, over 105F (about 1 child out of 16,000) Severe problems (very rare)  Serious allergic reaction (less than 1 out of a million doses)  Several other severe problems have been reported after DTaP vaccine. These include: ? Long-term seizures, coma, or lowered consciousness ? Permanent brain damage. These are so rare it is hard to tell if they are caused by the vaccine. Controlling fever is especially important for children who have had seizures, for any reason. It is also important if another family member has had seizures. You can reduce fever and pain by giving your child an aspirin-free pain reliever when the shot is given, and for the next 24 hours, following the package instructions. 6. What if there is a serious reaction? What should I look for? Look for anything that concerns you, such as signs of a severe allergic reaction, very high fever, or behavior changes. Signs of a severe allergic reaction can include hives, swelling of the face and throat, difficulty breathing, a fast heartbeat, dizziness, and weakness. These would start a few minutes to a few hours after the vaccination. What should I do?  If you think it is a severe allergic reaction or other emergency that can't wait, call 9-1-1 or get the person to the nearest hospital. Otherwise, call your doctor.  Afterward, the reaction should be reported to the Vaccine Adverse Event Reporting System (VAERS). Your doctor might file this report, or you can do it yourself through the VAERS web site at www.vaers.SamedayNews.es, or by calling (405)470-5854. ? VAERS is only for reporting reactions. They do not give medical advice. 7. The National Vaccine Injury Compensation Program The [employer] Vaccine Injury Compensation Program  (VICP) is a federal program that was created to compensate people who may have been injured by certain vaccines. Persons who believe they may have been injured by a vaccine can learn about the program and about filing a claim by calling 563 209 1426 or visiting the [place] website at GoldCloset.com.ee. 8. How can I learn more?  Ask your doctor.  Call your local or state health department.  Contact the Centers for Disease Control and Prevention (CDC): ? Call 559-080-9010 (1-800-CDC-INFO) or ? Visit CDC's website at http://hunter.com/ CDC DTaP Vaccine (Diphtheria, Tetanus, and Pertussis) VIS (05/02/06) This information is not intended to replace advice given to you by your health care provider. Make sure you discuss any questions you have with your health care provider. Document Released: 09/30/2006 Document Revised: 08/23/2016 Document Reviewed: 08/23/2016 Elsevier Interactive Patient Education  2017 Elsevier Inc.  Hypertension Hypertension, commonly called high blood pressure, is when the force of blood pumping through the arteries is too strong. The arteries are the blood vessels that carry blood from the heart throughout the  body. Hypertension forces the heart to work harder to pump blood and may cause arteries to become narrow or stiff. Having untreated or uncontrolled hypertension can cause heart attacks, strokes, kidney disease, and other problems. A blood pressure reading consists of a higher number over a lower number. Ideally, your blood pressure should be below 120/80. The first ("top") number is called the systolic pressure. It is a measure of the pressure in your arteries as your heart beats. The second ("bottom") number is called the diastolic pressure. It is a measure of the pressure in your arteries as the heart relaxes. What are the causes? The cause of this condition is not known. What increases the risk? Some risk factors for high blood pressure are under your  control. Others are not. Factors you can change  Smoking.  Having type 2 diabetes mellitus, high cholesterol, or both.  Not getting enough exercise or physical activity.  Being overweight.  Having too much fat, sugar, calories, or salt (sodium) in your diet.  Drinking too much alcohol. Factors that are difficult or impossible to change  Having chronic kidney disease.  Having a family history of high blood pressure.  Age. Risk increases with age.  Race. You may be at higher risk if you are African-American.  Gender. Men are at higher risk than women before age 76. After age 23, women are at higher risk than men.  Having obstructive sleep apnea.  Stress. What are the signs or symptoms? Extremely high blood pressure (hypertensive crisis) may cause:  Headache.  Anxiety.  Shortness of breath.  Nosebleed.  Nausea and vomiting.  Severe chest pain.  Jerky movements you cannot control (seizures).  How is this diagnosed? This condition is diagnosed by measuring your blood pressure while you are seated, with your arm resting on a surface. The cuff of the blood pressure monitor will be placed directly against the skin of your upper arm at the level of your heart. It should be measured at least twice using the same arm. Certain conditions can cause a difference in blood pressure between your right and left arms. Certain factors can cause blood pressure readings to be lower or higher than normal (elevated) for a short period of time:  When your blood pressure is higher when you are in a health care provider's office than when you are at home, this is called white coat hypertension. Most people with this condition do not need medicines.  When your blood pressure is higher at home than when you are in a health care provider's office, this is called masked hypertension. Most people with this condition may need medicines to control blood pressure.  If you have a high blood  pressure reading during one visit or you have normal blood pressure with other risk factors:  You may be asked to return on a different day to have your blood pressure checked again.  You may be asked to monitor your blood pressure at home for 1 week or longer.  If you are diagnosed with hypertension, you may have other blood or imaging tests to help your health care provider understand your overall risk for other conditions. How is this treated? This condition is treated by making healthy lifestyle changes, such as eating healthy foods, exercising more, and reducing your alcohol intake. Your health care provider may prescribe medicine if lifestyle changes are not enough to get your blood pressure under control, and if:  Your systolic blood pressure is above 130.  Your diastolic blood  pressure is above 80.  Your personal target blood pressure may vary depending on your medical conditions, your age, and other factors. Follow these instructions at home: Eating and drinking  Eat a diet that is high in fiber and potassium, and low in sodium, added sugar, and fat. An example eating plan is called the DASH (Dietary Approaches to Stop Hypertension) diet. To eat this way: ? Eat plenty of fresh fruits and vegetables. Try to fill half of your plate at each meal with fruits and vegetables. ? Eat whole grains, such as whole wheat pasta, brown rice, or whole grain bread. Fill about one quarter of your plate with whole grains. ? Eat or drink low-fat dairy products, such as skim milk or low-fat yogurt. ? Avoid fatty cuts of meat, processed or cured meats, and poultry with skin. Fill about one quarter of your plate with lean proteins, such as fish, chicken without skin, beans, eggs, and tofu. ? Avoid premade and processed foods. These tend to be higher in sodium, added sugar, and fat.  Reduce your daily sodium intake. Most people with hypertension should eat less than 1,500 mg of sodium a day.  Limit  alcohol intake to no more than 1 drink a day for nonpregnant women and 2 drinks a day for men. One drink equals 12 oz of beer, 5 oz of wine, or 1 oz of hard liquor. Lifestyle  Work with your health care provider to maintain a healthy body weight or to lose weight. Ask what an ideal weight is for you.  Get at least 30 minutes of exercise that causes your heart to beat faster (aerobic exercise) most days of the week. Activities may include walking, swimming, or biking.  Include exercise to strengthen your muscles (resistance exercise), such as pilates or lifting weights, as part of your weekly exercise routine. Try to do these types of exercises for 30 minutes at least 3 days a week.  Do not use any products that contain nicotine or tobacco, such as cigarettes and e-cigarettes. If you need help quitting, ask your health care provider.  Monitor your blood pressure at home as told by your health care provider.  Keep all follow-up visits as told by your health care provider. This is important. Medicines  Take over-the-counter and prescription medicines only as told by your health care provider. Follow directions carefully. Blood pressure medicines must be taken as prescribed.  Do not skip doses of blood pressure medicine. Doing this puts you at risk for problems and can make the medicine less effective.  Ask your health care provider about side effects or reactions to medicines that you should watch for. Contact a health care provider if:  You think you are having a reaction to a medicine you are taking.  You have headaches that keep coming back (recurring).  You feel dizzy.  You have swelling in your ankles.  You have trouble with your vision. Get help right away if:  You develop a severe headache or confusion.  You have unusual weakness or numbness.  You feel faint.  You have severe pain in your chest or abdomen.  You vomit repeatedly.  You have trouble  breathing. Summary  Hypertension is when the force of blood pumping through your arteries is too strong. If this condition is not controlled, it may put you at risk for serious complications.  Your personal target blood pressure may vary depending on your medical conditions, your age, and other factors. For most people, a normal  blood pressure is less than 120/80.  Hypertension is treated with lifestyle changes, medicines, or a combination of both. Lifestyle changes include weight loss, eating a healthy, low-sodium diet, exercising more, and limiting alcohol. This information is not intended to replace advice given to you by your health care provider. Make sure you discuss any questions you have with your health care provider. Document Released: 12/03/2005 Document Revised: 10/31/2016 Document Reviewed: 10/31/2016 Elsevier Interactive Patient Education  [employer].

## 2018-01-30 NOTE — Progress Notes (Signed)
Pre visit review using our clinic review tool, if applicable. No additional management support is needed unless otherwise documented below in the visit note. 

## 2018-02-06 ENCOUNTER — Other Ambulatory Visit: Payer: Self-pay | Admitting: Internal Medicine

## 2018-02-06 DIAGNOSIS — E785 Hyperlipidemia, unspecified: Secondary | ICD-10-CM

## 2018-02-06 MED ORDER — ATORVASTATIN CALCIUM 40 MG PO TABS: 40.0000 mg | ORAL_TABLET | Freq: Every day | ORAL | 3 refills | Status: DC

## 2018-04-29 ENCOUNTER — Ambulatory Visit: Payer: BLUE CROSS/BLUE SHIELD | Admitting: Internal Medicine

## 2018-05-08 ENCOUNTER — Ambulatory Visit: Payer: BLUE CROSS/BLUE SHIELD | Admitting: Family

## 2018-05-08 ENCOUNTER — Encounter: Payer: Self-pay | Admitting: Family

## 2018-05-08 VITALS — BP 124/84 | HR 79 | Temp 98.1°F | Resp 16 | Wt 156.5 lb

## 2018-05-08 DIAGNOSIS — H6121 Impacted cerumen, right ear: Secondary | ICD-10-CM | POA: Diagnosis not present

## 2018-05-08 NOTE — Patient Instructions (Signed)
Please purchase over the counter ear wax kit ("Debrox") to help prevent earwax buildup.   Call if recurrent ear symptoms.

## 2018-05-08 NOTE — Progress Notes (Signed)
Subjective:    Patient ID: Christine Cruz, female    DOB: Mar 01, 1951, 67 y.o.   MRN: [id number]  HPI   Christine Cruz is a 67 yr old female who presents today with c/o Right ear discomfort in the right ear since Saturday.  Feels like she can hear better when she pull her ear back.  She has been yawning/blowing her nose with out improvement. She has chronic hearing loss in the left ear which is at baseline.  She denies drainage, fever. + snus congestion. She has een using netti pot, vaporizer, benadryl, tylenol cold prep. No improvement.     Review of Systems    see HPI  Past Medical History:  Diagnosis Date  . Essential familial hypercholesterolemia   . History of blood transfusion   . History of hypertension   . Hypertension   . Menopause   . Osteoarthritis    hand  . Osteopenia      Social History   Socioeconomic History  . Marital status: Married    Spouse name: Not on file  . Number of children: Not on file  . Years of education: Not on file  . Highest education level: Not on file  Occupational History  . Not on file  Social Needs  . Financial resource strain: Not on file  . Food insecurity:    Worry: Not on file    Inability: Not on file  . Transportation needs:    Medical: Not on file    Non-medical: Not on file  Tobacco Use  . Smoking status: Former Smoker    Types: Cigarettes    Last attempt to quit: 04/27/2006    Years since quitting: 12.0  . Smokeless tobacco: Never Used  . Tobacco comment: former smoker age [age] took break 69 year 1/2 ppd max no FH lung cancer   Substance and Sexual Activity  . Alcohol use: Yes    Alcohol/week: 0.0 oz    Comment: occ  . Drug use: No  . Sexual activity: Not on file  Lifestyle  . Physical activity:    Days per week: Not on file    Minutes per session: Not on file  . Stress: Not on file  Relationships  . Social connections:    Talks on phone: Not on file    Gets together: Not on file    Attends religious service: Not  on file    Active member of club or organization: Not on file    Attends meetings of clubs or organizations: Not on file    Relationship status: Not on file  . Intimate partner violence:    Fear of current or ex partner: Not on file    Emotionally abused: Not on file    Physically abused: Not on file    Forced sexual activity: Not on file  Other Topics Concern  . Not on file  Social History Narrative   Unemployed   2 daughters 1 in [place] another [place]     Past Surgical History:  Procedure Laterality Date  . ABDOMINAL HYSTERECTOMY  1999  . BREAST BIOPSY  1966  . TONSILLECTOMY  1965  . TUBAL LIGATION      Family History  Problem Relation Age of Onset  . Arthritis Mother   . Heart disease Mother   . Stroke Mother   . Hypertension Mother   . Kidney disease Mother   . Diabetes Mother   . Breast cancer Sister  Allergies  Allergen Reactions  . Penicillin G Shortness Of Breath    Current Outpatient Medications on File Prior to Visit  Medication Sig Dispense Refill  . atorvastatin (LIPITOR) 40 MG tablet Take 1 tablet (40 mg total) by mouth daily at 6 PM. 90 tablet 3  . CALCIUM-MAGNESIUM PO Take by mouth daily.    Marland Kitchen losartan-hydrochlorothiazide (HYZAAR) 100-12.5 MG tablet Take 1 tablet by mouth daily. 90 tablet 3   No current facility-administered medications on file prior to visit.     BP 124/84 (BP Location: Left Arm, Patient Position: Sitting, Cuff Size: Normal)   Pulse 79   Temp 98.1 F (36.7 C) (Oral)   Resp 16   Wt 156 lb 8 oz (71 kg)   SpO2 98%   BMI 29.57 kg/m    Objective:   Physical Exam  Constitutional: She appears well-developed and well-nourished.  HENT:  Head: Normocephalic and atraumatic.  R cerumen impaction noted L TM normal, small amount of cerumen in left ear canal.   Cardiovascular: Normal rate, regular rhythm and normal heart sounds.  No murmur heard. Pulmonary/Chest: Effort normal and breath sounds normal. No  respiratory distress. She has no wheezes.  Psychiatric: She has a normal mood and affect. Her behavior is normal. Judgment and thought content normal.          Assessment & Plan:  Cerumen impaction- Ceruminosis is noted.  Wax is removed by syringing and manual debridement with curette . Instructions for home care to prevent wax buildup are given. Pt noted much improvement in hearing out of the right ear after wax removal.

## 2018-05-08 NOTE — Progress Notes (Signed)
Right ear irrigated wax flushed out without difficulty.  Patient tolerated procedure well.

## 2018-05-09 ENCOUNTER — Ambulatory Visit: Payer: BLUE CROSS/BLUE SHIELD | Admitting: Family Medicine

## 2018-05-21 ENCOUNTER — Encounter: Payer: Self-pay | Admitting: Internal Medicine

## 2018-05-21 ENCOUNTER — Ambulatory Visit (INDEPENDENT_AMBULATORY_CARE_PROVIDER_SITE_OTHER): Payer: MEDICARE | Admitting: Internal Medicine

## 2018-05-21 VITALS — BP 132/88 | HR 78 | Temp 98.4°F | Ht 61.0 in | Wt 157.0 lb

## 2018-05-21 DIAGNOSIS — H6122 Impacted cerumen, left ear: Secondary | ICD-10-CM

## 2018-05-21 DIAGNOSIS — E559 Vitamin D deficiency, unspecified: Secondary | ICD-10-CM

## 2018-05-21 DIAGNOSIS — E663 Overweight: Secondary | ICD-10-CM | POA: Diagnosis not present

## 2018-05-21 DIAGNOSIS — E785 Hyperlipidemia, unspecified: Secondary | ICD-10-CM | POA: Diagnosis not present

## 2018-05-21 DIAGNOSIS — R7303 Prediabetes: Secondary | ICD-10-CM | POA: Diagnosis not present

## 2018-05-21 DIAGNOSIS — Z1231 Encounter for screening mammogram for malignant neoplasm of breast: Secondary | ICD-10-CM | POA: Diagnosis not present

## 2018-05-21 DIAGNOSIS — I10 Essential (primary) hypertension: Secondary | ICD-10-CM | POA: Diagnosis not present

## 2018-05-21 NOTE — Progress Notes (Addendum)
Chief Complaint  Patient presents with  . Follow-up  . Hypertension   F/u  1. HTN controlled on hyzaar 100-12.5 mg qd  2. HLD controlled on lipitor 40 mg qhs will do trial off meds  3. Ears doing better since last visit has chronic hearing loss left ear and right had wax in it  4. Overweight c/w wt loss exercising more and no meat x 8-9 monhts 10K steps per day    Review of Systems  Constitutional: Negative for weight loss.  HENT: Positive for hearing loss.   Eyes: Negative for blurred vision.  Respiratory: Negative for shortness of breath.   Cardiovascular: Negative for chest pain.  Gastrointestinal: Negative for abdominal pain.  Musculoskeletal: Negative for falls.  Skin: Negative for rash.  Neurological: Negative for headaches.  Psychiatric/Behavioral: Negative for depression.   Past Medical History:  Diagnosis Date  . Essential familial hypercholesterolemia   . History of blood transfusion   . History of hypertension   . Hypertension   . Menopause   . Osteoarthritis    hand  . Osteopenia    Past Surgical History:  Procedure Laterality Date  . ABDOMINAL HYSTERECTOMY  1999  . BREAST BIOPSY  1966  . TONSILLECTOMY  1965  . TUBAL LIGATION     Family History  Problem Relation Age of Onset  . Arthritis Mother   . Heart disease Mother   . Stroke Mother   . Hypertension Mother   . Kidney disease Mother   . Diabetes Mother   . Breast cancer Sister    Social History   Socioeconomic History  . Marital status: Married    Spouse name: Not on file  . Number of children: Not on file  . Years of education: Not on file  . Highest education level: Not on file  Occupational History  . Not on file  Social Needs  . Financial resource strain: Not on file  . Food insecurity:    Worry: Not on file    Inability: Not on file  . Transportation needs:    Medical: Not on file    Non-medical: Not on file  Tobacco Use  . Smoking status: Former Smoker    Types: Cigarettes     Last attempt to quit: 04/27/2006    Years since quitting: 12.0  . Smokeless tobacco: Never Used  . Tobacco comment: former smoker age [age] took break 62 year 1/2 ppd max no FH lung cancer   Substance and Sexual Activity  . Alcohol use: Yes    Alcohol/week: 0.0 oz    Comment: occ  . Drug use: No  . Sexual activity: Not on file  Lifestyle  . Physical activity:    Days per week: Not on file    Minutes per session: Not on file  . Stress: Not on file  Relationships  . Social connections:    Talks on phone: Not on file    Gets together: Not on file    Attends religious service: Not on file    Active member of club or organization: Not on file    Attends meetings of clubs or organizations: Not on file    Relationship status: Not on file  . Intimate partner violence:    Fear of current or ex partner: Not on file    Emotionally abused: Not on file    Physically abused: Not on file    Forced sexual activity: Not on file  Other Topics Concern  . Not on  file  Social History Narrative   Unemployed   2 daughters 1 in House another [place]    Current Meds  Medication Sig  . atorvastatin (LIPITOR) 40 MG tablet Take 1 tablet (40 mg total) by mouth daily at 6 PM.  . CALCIUM-MAGNESIUM PO Take by mouth daily.  Marland Kitchen losartan-hydrochlorothiazide (HYZAAR) 100-12.5 MG tablet Take 1 tablet by mouth daily.   Allergies  Allergen Reactions  . Penicillin G Shortness Of Breath   No results found for this or any previous visit (from the past 2160 hour(s)). Objective  Body mass index is 29.66 kg/m. Wt Readings from Last 3 Encounters:  05/21/18 157 lb (71.2 kg)  05/08/18 156 lb 8 oz (71 kg)  01/30/18 159 lb 3.2 oz (72.2 kg)   Temp Readings from Last 3 Encounters:  05/21/18 98.4 F (36.9 C) (Oral)  05/08/18 98.1 F (36.7 C) (Oral)  01/30/18 97.7 F (36.5 C) (Oral)   BP Readings from Last 3 Encounters:  05/21/18 132/88  05/08/18 124/84  01/30/18 (!) 138/92   Pulse  Readings from Last 3 Encounters:  05/21/18 78  05/08/18 79  01/30/18 81    Physical Exam  Constitutional: She is oriented to person, place, and time. Vital signs are normal. She appears well-developed and well-nourished. She is cooperative.  HENT:  Head: Normocephalic and atraumatic.  Mouth/Throat: Oropharynx is clear and moist and mucous membranes are normal.  Left ear with cerumen impaction   Eyes: Pupils are equal, round, and reactive to light. Conjunctivae are normal.  Cardiovascular: Normal rate, regular rhythm and normal heart sounds.  Pulmonary/Chest: Effort normal and breath sounds normal.  Neurological: She is alert and oriented to person, place, and time. Gait normal.  Skin: Skin is warm, dry and intact.  Psychiatric: She has a normal mood and affect. Her speech is normal and behavior is normal. Judgment and thought content normal. Cognition and memory are normal.  Nursing note and vitals reviewed.   Assessment   1. HTN/HLD improved and controlled  2. Cerumen impaction left ear  3. Overweight BMI 29.66 with prediabetes A1C 6.3  4. HM Plan   1.  Cont meds  Stop lipitor 40 x 6 months and recheck cholesterol in 6 months  2. Removed ear wax left ear with currtte today  3. 10K steps per day diet improved  Refer preDM nutrition program  4.  Denies flu shot Tdap rec will disc again at f/u with shingrix, prevnar and pna 23  Declines Hep B/C/HIV   [facility] referred h/o 03/2016 colonospopy tubular/tubullovillous adenoma sch [facility]  -05/27/18 [facility] colonoscopy tubular adenoma x 2 neg high grade dysplasia f/u in 3 years   mammo referred [place] pt to sch  Pap get [facility] appt  Disc dexa at f/u age 30 y.o   [facility] [facility] 01/24/2019 aphakia right eye cataract right eye s/p surgery given pred phos, gatifloxacin, bromfenac qid and Durezol bid Dr. Marchia Meiers      Provider: Dr. Olivia Mackie McLean-Scocuzza-Internal Medicine

## 2018-05-21 NOTE — Patient Instructions (Addendum)
F/u in 6 months but schedule fasting labs before next visit in 6 months  Please sch mammogram and pap smear  Take care  We referred you to prediabetes program   Earwax Buildup, Adult The ears produce a substance called earwax that helps keep bacteria out of the ear and protects the skin in the ear canal. Occasionally, earwax can build up in the ear and cause discomfort or hearing loss. What increases the risk? This condition is more likely to develop in people who:  Are female.  Are elderly.  Naturally produce more earwax.  Clean their ears often with cotton swabs.  Use earplugs often.  Use in-ear headphones often.  Wear hearing aids.  Have narrow ear canals.  Have earwax that is overly thick or sticky.  Have eczema.  Are dehydrated.  Have excess hair in the ear canal.  What are the signs or symptoms? Symptoms of this condition include:  Reduced or muffled hearing.  A feeling of fullness in the ear or feeling that the ear is plugged.  Fluid coming from the ear.  Ear pain.  Ear itch.  Ringing in the ear.  Coughing.  An obvious piece of earwax that can be seen inside the ear canal.  How is this diagnosed? This condition may be diagnosed based on:  Your symptoms.  Your medical history.  An ear exam. During the exam, your health care provider will look into your ear with an instrument called an otoscope.  You may have tests, including a hearing test. How is this treated? This condition may be treated by:  Using ear drops to soften the earwax.  Having the earwax removed by a health care provider. The health care provider may: ? Flush the ear with water. ? Use an instrument that has a loop on the end (curette). ? Use a suction device.  Surgery to remove the wax buildup. This may be done in severe cases.  Follow these instructions at home:  Take over-the-counter and prescription medicines only as told by your health care provider.  Do not put any  objects, including cotton swabs, into your ear. You can clean the opening of your ear canal with a washcloth or facial tissue.  Follow instructions from your health care provider about cleaning your ears. Do not over-clean your ears.  Drink enough fluid to keep your urine clear or pale yellow. This will help to thin the earwax.  Keep all follow-up visits as told by your health care provider. If earwax builds up in your ears often or if you use hearing aids, consider seeing your health care provider for routine, preventive ear cleanings. Ask your health care provider how often you should schedule your cleanings.  If you have hearing aids, clean them according to instructions from the manufacturer and your health care provider. Contact a health care provider if:  You have ear pain.  You develop a fever.  You have blood, pus, or other fluid coming from your ear.  You have hearing loss.  You have ringing in your ears that does not go away.  Your symptoms do not improve with treatment.  You feel like the room is spinning (vertigo). Summary  Earwax can build up in the ear and cause discomfort or hearing loss.  The most common symptoms of this condition include reduced or muffled hearing and a feeling of fullness in the ear or feeling that the ear is plugged.  This condition may be diagnosed based on your symptoms, your  medical history, and an ear exam.  This condition may be treated by using ear drops to soften the earwax or by having the earwax removed by a health care provider.  Do not put any objects, including cotton swabs, into your ear. You can clean the opening of your ear canal with a washcloth or facial tissue. This information is not intended to replace advice given to you by your health care provider. Make sure you discuss any questions you have with your health care provider. Document Released: 01/10/2005 Document Revised: 02/13/2017 Document Reviewed: 02/13/2017 Elsevier  Interactive Patient Education  2018 [employer].  Exercising to [employer] Exercising can help you to lose weight. In order to lose weight through exercise, you need to do vigorous-intensity exercise. You can tell that you are exercising with vigorous intensity if you are breathing very hard and fast and cannot hold a conversation while exercising. Moderate-intensity exercise helps to maintain your current weight. You can tell that you are exercising at a moderate level if you have a higher heart rate and faster breathing, but you are still able to hold a conversation. How often should I exercise? Choose an activity that you enjoy and set realistic goals. Your health care provider can help you to make an activity plan that works for you. Exercise regularly as directed by your health care provider. This may include:  Doing resistance training twice each week, such as: ? Push-ups. ? Sit-ups. ? Lifting weights. ? Using resistance bands.  Doing a given intensity of exercise for a given amount of time. Choose from these options: ? 150 minutes of moderate-intensity exercise every week. ? 75 minutes of vigorous-intensity exercise every week. ? A mix of moderate-intensity and vigorous-intensity exercise every week.  Children, pregnant women, people who are out of shape, people who are overweight, and older adults may need to consult a health care provider for individual recommendations. If you have any sort of medical condition, be sure to consult your health care provider before starting a new exercise program. What are some activities that can help me to lose weight?  Walking at a rate of at least 4.5 miles an hour.  Jogging or running at a rate of 5 miles per hour.  Biking at a rate of at least 10 miles per hour.  Lap swimming.  Roller-skating or in-line skating.  Cross-country skiing.  Vigorous competitive sports, such as football, basketball, and soccer.  Jumping rope.  Aerobic  dancing. How can I be more active in my day-to-day activities?  Use the stairs instead of the elevator.  Take a walk during your lunch break.  If you drive, park your car farther away from work or school.  If you take public transportation, get off one stop early and walk the rest of the way.  Make all of your phone calls while standing up and walking around.  Get up, stretch, and walk around every 30 minutes throughout the day. What guidelines should I follow while exercising?  Do not exercise so much that you hurt yourself, feel dizzy, or get very short of breath.  Consult your health care provider prior to starting a new exercise program.  Wear comfortable clothes and shoes with good support.  Drink plenty of water while you exercise to prevent dehydration or heat stroke. Body water is lost during exercise and must be replaced.  Work out until you breathe faster and your heart beats faster. This information is not intended to replace advice given to  you by your health care provider. Make sure you discuss any questions you have with your health care provider. Document Released: 01/05/2011 Document Revised: 05/10/2016 Document Reviewed: 05/06/2014 Elsevier Interactive Patient Education  2018 [employer].  Cholesterol Cholesterol is a white, waxy, fat-like substance that is needed by the human body in small amounts. The liver makes all the cholesterol we need. Cholesterol is carried from the liver by the blood through the blood vessels. Deposits of cholesterol (plaques) may build up on blood vessel (artery) walls. Plaques make the arteries narrower and stiffer. Cholesterol plaques increase the risk for heart attack and stroke. You cannot feel your cholesterol level even if it is very high. The only way to know that it is high is to have a blood test. Once you know your cholesterol levels, you should keep a record of the test results. Work with your health care provider to keep your  levels in the desired range. What do the results mean?  Total cholesterol is a rough measure of all the cholesterol in your blood.  LDL (low-density lipoprotein) is the "bad" cholesterol. This is the type that causes plaque to build up on the artery walls. You want this level to be low.  HDL (high-density lipoprotein) is the "good" cholesterol because it cleans the arteries and carries the LDL away. You want this level to be high.  Triglycerides are fat that the body can either burn for energy or store. High levels are closely linked to heart disease. What are the desired levels of cholesterol?  Total cholesterol below 200.  LDL below 100 for people who are at risk, below 70 for people at very high risk.  HDL above 40 is good. A level of 60 or higher is considered to be protective against heart disease.  Triglycerides below 150. How can I lower my cholesterol? Diet Follow your diet program as told by your health care provider.  Choose fish or white meat chicken and [place], roasted or baked. Limit fatty cuts of red meat, fried foods, and processed meats, such as sausage and lunch meats.  Eat lots of fresh fruits and vegetables.  Choose whole grains, beans, pasta, potatoes, and cereals.  Choose olive oil, corn oil, or canola oil, and use only small amounts.  Avoid butter, mayonnaise, shortening, or palm kernel oils.  Avoid foods with trans fats.  Drink skim or nonfat milk and eat low-fat or nonfat yogurt and cheeses. Avoid whole milk, cream, ice cream, egg yolks, and full-fat cheeses.  Healthier desserts include angel food cake, ginger snaps, animal crackers, hard candy, popsicles, and low-fat or nonfat frozen yogurt. Avoid pastries, cakes, pies, and cookies.  Exercise  Follow your exercise program as told by your health care provider. A regular program: ? Helps to decrease LDL and raise HDL. ? Helps with weight control.  Do things that increase your activity level, such as  gardening, walking, and taking the stairs.  Ask your health care provider about ways that you can be more active in your daily life.  Medicine  Take over-the-counter and prescription medicines only as told by your health care provider. ? Medicine may be prescribed by your health care provider to help lower cholesterol and decrease the risk for heart disease. This is usually done if diet and exercise have failed to bring down cholesterol levels. ? If you have several risk factors, you may need medicine even if your levels are normal.  This information is not intended to replace advice given to  you by your health care provider. Make sure you discuss any questions you have with your health care provider. Document Released: 08/28/2001 Document Revised: 06/30/2016 Document Reviewed: 06/02/2016 Elsevier Interactive Patient Education  [employer].

## 2018-05-21 NOTE — Progress Notes (Signed)
Pre visit review using our clinic review tool, if applicable. No additional management support is needed unless otherwise documented below in the visit note. 

## 2018-05-22 ENCOUNTER — Encounter: Payer: Self-pay | Admitting: *Deleted

## 2018-05-23 ENCOUNTER — Ambulatory Visit
Admission: RE | Admit: 2018-05-23 | Discharge: 2018-05-23 | Disposition: A | Discharge status: Home / Self Care | Payer: MEDICARE | Source: Ambulatory Visit | Attending: Gastroenterology | Admitting: Gastroenterology

## 2018-05-23 ENCOUNTER — Ambulatory Visit: Payer: MEDICARE | Admitting: Certified Registered Nurse Anesthetist

## 2018-05-23 ENCOUNTER — Encounter: Payer: Self-pay | Admitting: Certified Registered Nurse Anesthetist

## 2018-05-23 ENCOUNTER — Encounter
Admission: RE | Disposition: A | Discharge status: Home / Self Care | Payer: Self-pay | Source: Ambulatory Visit | Attending: Gastroenterology

## 2018-05-23 DIAGNOSIS — Z1211 Encounter for screening for malignant neoplasm of colon: Secondary | ICD-10-CM | POA: Insufficient documentation

## 2018-05-23 DIAGNOSIS — K5730 Diverticulosis of large intestine without perforation or abscess without bleeding: Secondary | ICD-10-CM | POA: Insufficient documentation

## 2018-05-23 DIAGNOSIS — D123 Benign neoplasm of transverse colon: Secondary | ICD-10-CM | POA: Insufficient documentation

## 2018-05-23 DIAGNOSIS — Z880 Allergy status to penicillin: Secondary | ICD-10-CM | POA: Insufficient documentation

## 2018-05-23 DIAGNOSIS — D122 Benign neoplasm of ascending colon: Secondary | ICD-10-CM | POA: Insufficient documentation

## 2018-05-23 DIAGNOSIS — E7800 Pure hypercholesterolemia, unspecified: Secondary | ICD-10-CM | POA: Diagnosis not present

## 2018-05-23 DIAGNOSIS — Z79899 Other long term (current) drug therapy: Secondary | ICD-10-CM | POA: Diagnosis not present

## 2018-05-23 DIAGNOSIS — I10 Essential (primary) hypertension: Secondary | ICD-10-CM | POA: Insufficient documentation

## 2018-05-23 DIAGNOSIS — K5790 Diverticulosis of intestine, part unspecified, without perforation or abscess without bleeding: Secondary | ICD-10-CM | POA: Diagnosis not present

## 2018-05-23 DIAGNOSIS — Z86010 Personal history of colonic polyps: Secondary | ICD-10-CM | POA: Insufficient documentation

## 2018-05-23 DIAGNOSIS — K635 Polyp of colon: Secondary | ICD-10-CM | POA: Diagnosis not present

## 2018-05-23 HISTORY — DX: Age-related osteoporosis without current pathological fracture: M81.0

## 2018-05-23 HISTORY — PX: COLONOSCOPY WITH PROPOFOL: SHX5780

## 2018-05-23 LAB — HM COLONOSCOPY

## 2018-05-23 SURGERY — COLONOSCOPY WITH PROPOFOL
Anesthesia: General

## 2018-05-23 MED ORDER — PROPOFOL 500 MG/50ML IV EMUL
INTRAVENOUS | Status: DC | PRN
  Administered 2018-05-23: 85 ug/kg/min via INTRAVENOUS

## 2018-05-23 MED ORDER — PROPOFOL 10 MG/ML IV BOLUS
INTRAVENOUS | Status: DC | PRN
  Administered 2018-05-23: 50 mg via INTRAVENOUS
  Administered 2018-05-23 (×2): 30 mg via INTRAVENOUS

## 2018-05-23 MED ORDER — SODIUM CHLORIDE 0.9 % IV SOLN
INTRAVENOUS | Status: DC
  Administered 2018-05-23: 1000 mL via INTRAVENOUS

## 2018-05-23 MED ORDER — LIDOCAINE HCL (CARDIAC) PF 100 MG/5ML IV SOSY
PREFILLED_SYRINGE | INTRAVENOUS | Status: DC | PRN
  Administered 2018-05-23: 50 mg via INTRAVENOUS

## 2018-05-23 MED ORDER — SODIUM CHLORIDE 0.9 % IV SOLN
INTRAVENOUS | Status: DC
  Administered 2018-05-23: 14:00:00 via INTRAVENOUS

## 2018-05-23 NOTE — Anesthesia Post-op Follow-up Note (Signed)
Anesthesia QCDR form completed.        

## 2018-05-23 NOTE — H&P (Addendum)
Outpatient short stay form Pre-procedure 05/23/2018 2:23 PM Christine Sails MD  Primary Physician:Dr Tracey Mclean-Scocuzza  Reason for visit: Colonoscopy  History of present illness: Patient is a 67 year old female presenting today as above.  Her last colonoscopy was about 2 years ago with removal of a large polyp from the ascending colon at that time, 03/29/2016.  This was a tubulovillous adenoma.  There is also a smaller tubular adenoma removed from the hepatic flexure.  She tolerated her prep well.  She takes no aspirin or blood thinning agent.    Current Facility-Administered Medications:  .  0.9 %  sodium chloride infusion, , Intravenous, Continuous, Christine Sails, MD .  0.9 %  sodium chloride infusion, , Intravenous, Continuous, Christine Sails, MD, Last Rate: 20 mL/hr at 05/23/18 1408, 1,000 mL at 05/23/18 1408  Medications Prior to Admission  Medication Sig Dispense Refill Last Dose  . atorvastatin (LIPITOR) 40 MG tablet Take 40 mg by mouth daily.   Past Week at Unknown time  . CALCIUM-MAGNESIUM PO Take by mouth daily.   Past Week at Unknown time  . co-enzyme Q-10 30 MG capsule Take 30 mg by mouth 3 (three) times daily.   Past Week at Unknown time  . losartan-hydrochlorothiazide (HYZAAR) 100-12.5 MG tablet Take 1 tablet by mouth daily. 90 tablet 3 05/23/2018 at Unknown time  . omega-3 acid ethyl esters (LOVAZA) 1 g capsule Take 1 g by mouth 2 (two) times daily.   Past Week at Unknown time     Allergies  Allergen Reactions  . Penicillin G Shortness Of Breath     Past Medical History:  Diagnosis Date  . Essential familial hypercholesterolemia   . History of blood transfusion   . History of hypertension   . Hypertension   . Menopause   . Osteoarthritis    hand  . Osteopenia   . Osteoporosis     Review of systems:      Physical Exam    Heart and lungs: Regular rate and rhythm without rub or gallop, lungs are bilaterally clear.    HEENT: Normocephalic  atraumatic eyes are anicteric    Other:    Pertinant exam for procedure: Soft nontender nondistended bowel sounds positive normoactive.    Planned proceedures: Colonoscopy and indicated procedures. I have discussed the risks benefits and complications of procedures to include not limited to bleeding, infection, perforation and the risk of sedation and the patient wishes to proceed.    Christine Sails, MD Gastroenterology 05/23/2018  2:23 PM

## 2018-05-23 NOTE — Op Note (Signed)
[facility] Gastroenterology Patient Name: Christine Cruz Procedure Date: 05/23/2018 2:28 PM MRN: [id number] Account #: [id number] Date of Birth: 1951-02-22 Admit Type: Outpatient Age: 67 Room: [facility] ENDO ROOM 3 Gender: Female Note Status: Finalized Procedure:            Colonoscopy Indications:          Personal history of colonic polyps Providers:            Lollie Sails, MD Referring MD:         Nino Glow Mclean-Scocuzza MD, MD (Referring MD) Medicines:            Monitored Anesthesia Care Complications:        No immediate complications. Procedure:            Pre-Anesthesia Assessment:                       - ASA Grade Assessment: II - A patient with mild                        systemic disease.                       After obtaining informed consent, the colonoscope was                        passed under direct vision. Throughout the procedure,                        the patient's blood pressure, pulse, and oxygen                        saturations were monitored continuously. The                        Colonoscope was introduced through the anus and                        advanced to the the cecum, identified by appendiceal                        orifice and ileocecal valve. The colonoscopy was                        unusually difficult due to a redundant colon.                        Successful completion of the procedure was aided by                        changing the patient to a supine position, changing the                        patient to a prone position and using manual pressure.                        The patient tolerated the procedure well. The quality                        of the bowel preparation was good. Findings:      Multiple small-mouthed diverticula were found  in the sigmoid colon,       descending colon and transverse colon.      Two sessile polyps were found in the distal ascending colon. The polyps       were 4 to 5 mm in size.  These polyps were removed with a cold snare and       cold forcep. Resection and retrieval were complete. Area was tattooed       with an injection of 2 mL of [place] ink.      A 2 mm polyp was found in the proximal transverse colon. The polyp was       sessile. The polyp was removed with a cold biopsy forceps. Resection and       retrieval were complete.      The digital rectal exam was normal. Impression:           - Diverticulosis in the sigmoid colon, in the                        descending colon and in the transverse colon.                       - Two 4 to 5 mm polyps in the distal ascending colon,                        removed with a cold snare. Resected and retrieved.                        Tattooed.                       - One 2 mm polyp in the proximal transverse colon,                        removed with a cold biopsy forceps. Resected and                        retrieved. Recommendation:       - Discharge patient to home.                       - Telephone GI clinic for pathology results in 1 week. Procedure Code(s):    --- Professional ---                       [phone], Colonoscopy, flexible; with removal of tumor(s),                        polyp(s), or other lesion(s) by snare technique                       45381, Colonoscopy, flexible; with directed submucosal                        injection(s), any substance                       49449, 59, Colonoscopy, flexible; with biopsy, single                        or multiple Diagnosis Code(s):    --- Professional ---  D12.2, Benign neoplasm of ascending colon                       D12.3, Benign neoplasm of transverse colon (hepatic                        flexure or splenic flexure)                       Z86.010, Personal history of colonic polyps                       K57.30, Diverticulosis of large intestine without                        perforation or abscess without bleeding CPT copyright 2017 American  Medical Association. All rights reserved. The codes documented in this report are preliminary and upon coder review may  be revised to meet current compliance requirements. Lollie Sails, MD 05/23/2018 3:30:07 PM This report has been signed electronically. Number of Addenda: 0 Note Initiated On: 05/23/2018 2:28 PM Scope Withdrawal Time: 0 hours 23 minutes 54 seconds  Total Procedure Duration: 0 hours 46 minutes 10 seconds       [facility]

## 2018-05-23 NOTE — Anesthesia Preprocedure Evaluation (Signed)
Anesthesia Evaluation  Patient identified by MRN, date of birth, ID band Patient awake    Reviewed: Allergy & Precautions, H&P , NPO status , Patient's Chart, lab work & pertinent test results, reviewed documented beta blocker date and time   History of Anesthesia Complications Negative for: history of anesthetic complications  Airway Mallampati: III  TM Distance: >3 FB Neck ROM: full    Dental  (+) Caps, Dental Advidsory Given, Teeth Intact, Chipped, Missing   Pulmonary neg pulmonary ROS, former smoker,           Cardiovascular Exercise Tolerance: Good hypertension, (-) angina(-) CAD, (-) Past MI, (-) Cardiac Stents and (-) CABG (-) dysrhythmias (-) Valvular Problems/Murmurs     Neuro/Psych negative neurological ROS  negative psych ROS   GI/Hepatic negative GI ROS, Neg liver ROS,   Endo/Other  negative endocrine ROS  Renal/GU negative Renal ROS  negative genitourinary   Musculoskeletal   Abdominal   Peds  Hematology negative hematology ROS (+)   Anesthesia Other Findings Past Medical History: No date: Essential familial hypercholesterolemia No date: History of blood transfusion No date: History of hypertension No date: Hypertension No date: Menopause No date: Osteoarthritis     Comment:  hand No date: Osteopenia No date: Osteoporosis   Reproductive/Obstetrics negative OB ROS                             Anesthesia Physical Anesthesia Plan  ASA: II  Anesthesia Plan: General   Post-op Pain Management:    Induction: Intravenous  PONV Risk Score and Plan: 3 and Propofol infusion  Airway Management Planned: Nasal Cannula  Additional Equipment:   Intra-op Plan:   Post-operative Plan:   Informed Consent: I have reviewed the patients History and Physical, chart, labs and discussed the procedure including the risks, benefits and alternatives for the proposed anesthesia with  the patient or authorized representative who has indicated his/her understanding and acceptance.   Dental Advisory Given  Plan Discussed with: Anesthesiologist, CRNA and Surgeon  Anesthesia Plan Comments:         Anesthesia Quick Evaluation

## 2018-05-23 NOTE — Transfer of Care (Signed)
Anesthesia Post Note  Patient: Christine Cruz  Procedure(s) Performed: Procedure(s) (LRB): COLONOSCOPY WITH PROPOFOL (N/A)  Anesthesia type: MAC  Patient location: Phase II  Post pain: Pain level controlled  Post assessment: Post-op Vital signs reviewed  Last Vitals:  Vitals:   05/23/18 1338 05/23/18 1529  BP: (!) 141/86 109/80  Pulse: 93 79  Resp: 18 (!) 22  Temp:  (!) 36.3 C  SpO2: 98% 100%    Post vital signs: stable  Level of consciousness: Patient remains intubated per anesthesia plan  Complications: No apparent anesthesia complications

## 2018-05-26 NOTE — Anesthesia Postprocedure Evaluation (Signed)
Anesthesia Post Note  Patient: Christine Cruz  Procedure(s) Performed: COLONOSCOPY WITH PROPOFOL (N/A )  Patient location during evaluation: Endoscopy Anesthesia Type: General Level of consciousness: awake and alert Pain management: pain level controlled Vital Signs Assessment: post-procedure vital signs reviewed and stable Respiratory status: spontaneous breathing, nonlabored ventilation, respiratory function stable and patient connected to nasal cannula oxygen Cardiovascular status: blood pressure returned to baseline and stable Postop Assessment: no apparent nausea or vomiting Anesthetic complications: no     Last Vitals:  Vitals:   05/23/18 1529 05/23/18 1539  BP: 109/80 124/78  Pulse: 79 72  Resp: (!) 22 (!) 24  Temp: (!) 36.3 C   SpO2: 100% 100%    Last Pain:  Vitals:   05/23/18 1539  TempSrc:   PainSc: 0-No pain                 Martha Clan

## 2018-05-27 ENCOUNTER — Encounter: Payer: Self-pay | Admitting: Gastroenterology

## 2018-05-27 LAB — SURGICAL PATHOLOGY

## 2018-05-28 ENCOUNTER — Telehealth: Payer: Self-pay | Admitting: Internal Medicine

## 2018-05-28 NOTE — Telephone Encounter (Signed)
Copied from [place] [id number]. Topic: General - Other >> May 28, 2018  1:01 PM Marin Olp L wrote: Reason for CRM: Patient calling to notify Lydia Guiles that the date of her last mammo was 12/28/20016 at the [facility].

## 2018-06-15 ENCOUNTER — Encounter: Payer: Self-pay | Admitting: Internal Medicine

## 2018-06-15 NOTE — Progress Notes (Signed)
Colonoscopy had 05/23/18 diverticulosis, polpys [facility]

## 2018-07-16 ENCOUNTER — Ambulatory Visit
Admission: RE | Admit: 2018-07-16 | Discharge: 2018-07-16 | Disposition: A | Discharge status: Home / Self Care | Payer: MEDICARE | Source: Ambulatory Visit | Attending: Internal Medicine | Admitting: Internal Medicine

## 2018-07-16 DIAGNOSIS — Z1231 Encounter for screening mammogram for malignant neoplasm of breast: Secondary | ICD-10-CM | POA: Diagnosis not present

## 2018-07-21 ENCOUNTER — Other Ambulatory Visit: Payer: Self-pay | Admitting: Internal Medicine

## 2018-07-21 NOTE — Telephone Encounter (Signed)
Copied from [place] [id number]. Topic: Quick Communication - See Telephone Encounter >> Jul 21, 2018 11:26 AM Conception Chancy, NT wrote: CRM for notification. See Telephone encounter for: 07/21/18.  Patient is calling and is requesting a refill on losartan-hydrochlorothiazide (HYZAAR) 100-12.5 MG tablet. Please advise.   [id number] [place] [address] [place] [address] Phone: [phone] Fax: [phone]

## 2018-07-21 NOTE — Telephone Encounter (Signed)
Losartan-Hydrochlorothiazide (Hyzaar) 100-12.5mg  tab refill Last Refilled by Dr. Lacinda Axon  Last OV: 05/21/18 PCP: Dr. Terese Door [facility]  [address]

## 2018-07-22 ENCOUNTER — Inpatient Hospital Stay
Admission: RE | Admit: 2018-07-22 | Discharge: 2018-07-22 | Disposition: A | Discharge status: Home / Self Care | Payer: Self-pay | Source: Ambulatory Visit | Attending: *Deleted | Admitting: *Deleted

## 2018-07-22 ENCOUNTER — Other Ambulatory Visit: Payer: Self-pay | Admitting: *Deleted

## 2018-07-22 DIAGNOSIS — Z9289 Personal history of other medical treatment: Secondary | ICD-10-CM

## 2018-07-22 MED ORDER — LOSARTAN POTASSIUM-HCTZ 100-12.5 MG PO TABS: 1.0000 | ORAL_TABLET | Freq: Every day | ORAL | 3 refills | Status: DC

## 2018-07-22 NOTE — Telephone Encounter (Signed)
Rx request This was prescribed by Dr. Lacinda Axon patient last saw Dr. Aundra Dubin on 05-21-18.

## 2018-09-26 ENCOUNTER — Ambulatory Visit: Payer: MEDICARE

## 2018-10-24 DIAGNOSIS — H268 Other specified cataract: Secondary | ICD-10-CM | POA: Diagnosis not present

## 2019-01-06 DIAGNOSIS — H25041 Posterior subcapsular polar age-related cataract, right eye: Secondary | ICD-10-CM | POA: Diagnosis not present

## 2019-01-12 ENCOUNTER — Encounter: Payer: Self-pay | Admitting: *Deleted

## 2019-01-13 ENCOUNTER — Ambulatory Visit: Payer: MEDICARE | Admitting: Anesthesiology

## 2019-01-13 ENCOUNTER — Ambulatory Visit
Admission: RE | Admit: 2019-01-13 | Discharge: 2019-01-13 | Disposition: A | Discharge status: Home / Self Care | Payer: MEDICARE | Attending: Ophthalmology | Admitting: Ophthalmology

## 2019-01-13 ENCOUNTER — Other Ambulatory Visit: Payer: Self-pay

## 2019-01-13 ENCOUNTER — Encounter: Payer: Self-pay | Admitting: *Deleted

## 2019-01-13 ENCOUNTER — Encounter
Admission: RE | Disposition: A | Discharge status: Home / Self Care | Payer: Self-pay | Source: Home / Self Care | Attending: Ophthalmology

## 2019-01-13 DIAGNOSIS — I10 Essential (primary) hypertension: Secondary | ICD-10-CM | POA: Diagnosis not present

## 2019-01-13 DIAGNOSIS — Y838 Other surgical procedures as the cause of abnormal reaction of the patient, or of later complication, without mention of misadventure at the time of the procedure: Secondary | ICD-10-CM | POA: Diagnosis not present

## 2019-01-13 DIAGNOSIS — E7800 Pure hypercholesterolemia, unspecified: Secondary | ICD-10-CM | POA: Diagnosis not present

## 2019-01-13 DIAGNOSIS — H2701 Aphakia, right eye: Secondary | ICD-10-CM | POA: Insufficient documentation

## 2019-01-13 DIAGNOSIS — Z79899 Other long term (current) drug therapy: Secondary | ICD-10-CM | POA: Insufficient documentation

## 2019-01-13 DIAGNOSIS — H59211 Accidental puncture and laceration of right eye and adnexa during an ophthalmic procedure: Secondary | ICD-10-CM | POA: Insufficient documentation

## 2019-01-13 DIAGNOSIS — H25041 Posterior subcapsular polar age-related cataract, right eye: Secondary | ICD-10-CM | POA: Insufficient documentation

## 2019-01-13 DIAGNOSIS — Z87891 Personal history of nicotine dependence: Secondary | ICD-10-CM | POA: Diagnosis not present

## 2019-01-13 DIAGNOSIS — H2511 Age-related nuclear cataract, right eye: Secondary | ICD-10-CM | POA: Diagnosis not present

## 2019-01-13 HISTORY — PX: CATARACT EXTRACTION W/PHACO: SHX586

## 2019-01-13 SURGERY — PHACOEMULSIFICATION, CATARACT, WITH IOL INSERTION
Anesthesia: Monitor Anesthesia Care | Site: Eye | Laterality: Right

## 2019-01-13 MED ORDER — EPHEDRINE SULFATE 50 MG/ML IJ SOLN
INTRAMUSCULAR | Status: DC | PRN
  Administered 2019-01-13 (×2): 5 mg via INTRAVENOUS

## 2019-01-13 MED ORDER — TRIAMCINOLONE ACETONIDE 40 MG/ML IJ SUSP
INTRAMUSCULAR | Status: AC
  Filled 2019-01-13: qty 1

## 2019-01-13 MED ORDER — MIDAZOLAM HCL 2 MG/2ML IJ SOLN
INTRAMUSCULAR | Status: AC
  Filled 2019-01-13: qty 2

## 2019-01-13 MED ORDER — SODIUM HYALURONATE 23 MG/ML IO SOLN
INTRAOCULAR | Status: AC
  Filled 2019-01-13: qty 0.6

## 2019-01-13 MED ORDER — SODIUM CHLORIDE (PF) 0.9 % IJ SOLN
INTRAMUSCULAR | Status: AC
  Filled 2019-01-13: qty 10

## 2019-01-13 MED ORDER — ACETAZOLAMIDE SODIUM 500 MG IJ SOLR
INTRAMUSCULAR | Status: DC | PRN
  Administered 2019-01-13: 500 mg via INTRAVENOUS

## 2019-01-13 MED ORDER — MOXIFLOXACIN HCL 0.5 % OP SOLN
OPHTHALMIC | Status: AC
  Filled 2019-01-13: qty 3

## 2019-01-13 MED ORDER — ONDANSETRON HCL 4 MG/2ML IJ SOLN
INTRAMUSCULAR | Status: AC
  Filled 2019-01-13: qty 2

## 2019-01-13 MED ORDER — SODIUM CHLORIDE 0.9 % IV SOLN
INTRAVENOUS | Status: DC
  Administered 2019-01-13: 07:00:00 via INTRAVENOUS

## 2019-01-13 MED ORDER — NA CHONDROIT SULF-NA HYALURON 40-17 MG/ML IO SOLN
INTRAOCULAR | Status: DC | PRN
  Administered 2019-01-13: 1 mL via INTRAOCULAR

## 2019-01-13 MED ORDER — ARMC OPHTHALMIC DILATING DROPS
1.0000 "application " | OPHTHALMIC | Status: AC
  Administered 2019-01-13 (×3): 1 via OPHTHALMIC

## 2019-01-13 MED ORDER — POVIDONE-IODINE 5 % OP SOLN
OPHTHALMIC | Status: AC
  Filled 2019-01-13: qty 60

## 2019-01-13 MED ORDER — TRIAMCINOLONE ACETONIDE 40 MG/ML IJ SUSP
INTRAMUSCULAR | Status: DC | PRN
  Administered 2019-01-13: 40 mg

## 2019-01-13 MED ORDER — ACETAZOLAMIDE SODIUM 500 MG IJ SOLR
INTRAMUSCULAR | Status: AC
  Filled 2019-01-13: qty 500

## 2019-01-13 MED ORDER — EPHEDRINE SULFATE 50 MG/ML IJ SOLN
INTRAMUSCULAR | Status: AC
  Filled 2019-01-13: qty 1

## 2019-01-13 MED ORDER — DEXAMETHASONE SODIUM PHOSPHATE 10 MG/ML IJ SOLN
INTRAMUSCULAR | Status: DC | PRN
  Administered 2019-01-13: 5 mg via INTRAVENOUS

## 2019-01-13 MED ORDER — TETRACAINE HCL 0.5 % OP SOLN
1.0000 [drp] | OPHTHALMIC | Status: AC | PRN
  Administered 2019-01-13 (×3): 1 [drp] via OPHTHALMIC

## 2019-01-13 MED ORDER — TETRACAINE HCL 0.5 % OP SOLN
OPHTHALMIC | Status: AC
  Administered 2019-01-13: 1 [drp] via OPHTHALMIC
  Filled 2019-01-13: qty 4

## 2019-01-13 MED ORDER — NA HYALUR & NA CHOND-NA HYALUR 0.55-0.5 ML IO KIT
PACK | INTRAOCULAR | Status: AC
  Filled 2019-01-13: qty 1.05

## 2019-01-13 MED ORDER — MIDAZOLAM HCL 2 MG/2ML IJ SOLN
INTRAMUSCULAR | Status: DC | PRN
  Administered 2019-01-13: 1 mg via INTRAVENOUS
  Administered 2019-01-13 (×2): 0.5 mg via INTRAVENOUS

## 2019-01-13 MED ORDER — MOXIFLOXACIN HCL 0.5 % OP SOLN: 1.0000 [drp] | OPHTHALMIC | Status: DC | PRN

## 2019-01-13 MED ORDER — DORZOLAMIDE HCL-TIMOLOL MAL 2-0.5 % OP SOLN
OPHTHALMIC | Status: DC | PRN
  Administered 2019-01-13: 1 [drp] via OPHTHALMIC

## 2019-01-13 MED ORDER — ONDANSETRON HCL 4 MG/2ML IJ SOLN
INTRAMUSCULAR | Status: DC | PRN
  Administered 2019-01-13: 4 mg via INTRAVENOUS

## 2019-01-13 MED ORDER — FENTANYL CITRATE (PF) 100 MCG/2ML IJ SOLN
INTRAMUSCULAR | Status: DC | PRN
  Administered 2019-01-13 (×2): 25 ug via INTRAVENOUS
  Administered 2019-01-13: 50 ug via INTRAVENOUS

## 2019-01-13 MED ORDER — EPINEPHRINE PF 1 MG/ML IJ SOLN
INTRAMUSCULAR | Status: AC
  Filled 2019-01-13: qty 1

## 2019-01-13 MED ORDER — POVIDONE-IODINE 5 % OP SOLN
OPHTHALMIC | Status: DC | PRN
  Administered 2019-01-13: 1 via OPHTHALMIC

## 2019-01-13 MED ORDER — NA HYALUR & NA CHOND-NA HYALUR 0.55-0.5 ML IO KIT
PACK | INTRAOCULAR | Status: DC | PRN
  Administered 2019-01-13: 1 via OPHTHALMIC

## 2019-01-13 MED ORDER — FENTANYL CITRATE (PF) 100 MCG/2ML IJ SOLN
INTRAMUSCULAR | Status: AC
  Filled 2019-01-13: qty 2

## 2019-01-13 MED ORDER — LIDOCAINE HCL (PF) 4 % IJ SOLN
INTRAMUSCULAR | Status: AC
  Filled 2019-01-13: qty 5

## 2019-01-13 MED ORDER — DEXAMETHASONE SODIUM PHOSPHATE 10 MG/ML IJ SOLN
INTRAMUSCULAR | Status: AC
  Filled 2019-01-13: qty 1

## 2019-01-13 MED ORDER — TRYPAN BLUE 0.06 % OP SOLN
OPHTHALMIC | Status: DC | PRN
  Administered 2019-01-13: .5 mL via INTRAOCULAR

## 2019-01-13 MED ORDER — MOXIFLOXACIN HCL 0.5 % OP SOLN
OPHTHALMIC | Status: DC | PRN
  Administered 2019-01-13: .2 mL via OPHTHALMIC

## 2019-01-13 MED ORDER — ARMC OPHTHALMIC DILATING DROPS
OPHTHALMIC | Status: AC
  Administered 2019-01-13: 1 via OPHTHALMIC
  Filled 2019-01-13: qty 0.5

## 2019-01-13 MED ORDER — NEOMYCIN-POLYMYXIN-DEXAMETH 3.5-10000-0.1 OP OINT
TOPICAL_OINTMENT | OPHTHALMIC | Status: AC
  Filled 2019-01-13: qty 3.5

## 2019-01-13 MED ORDER — LIDOCAINE HCL (PF) 4 % IJ SOLN
INTRAOCULAR | Status: DC | PRN
  Administered 2019-01-13: 2 mL via OPHTHALMIC

## 2019-01-13 MED ORDER — CARBACHOL 0.01 % IO SOLN
INTRAOCULAR | Status: DC | PRN
  Administered 2019-01-13: .5 mL via INTRAOCULAR

## 2019-01-13 MED ORDER — EPINEPHRINE PF 1 MG/ML IJ SOLN
INTRAOCULAR | Status: DC | PRN
  Administered 2019-01-13: 1 mL via OPHTHALMIC

## 2019-01-13 MED ORDER — DORZOLAMIDE HCL-TIMOLOL MAL 2-0.5 % OP SOLN
OPHTHALMIC | Status: AC
  Filled 2019-01-13: qty 10

## 2019-01-13 MED ORDER — TRYPAN BLUE 0.06 % OP SOLN
OPHTHALMIC | Status: AC
  Filled 2019-01-13: qty 0.5

## 2019-01-13 SURGICAL SUPPLY — 20 items
DISSECTOR HYDRO NUCLEUS 50X22 (MISCELLANEOUS) ×8 IMPLANT
GLOVE BIOGEL M 6.5 STRL (GLOVE) ×2 IMPLANT
GOWN STRL REUS W/ TWL LRG LVL3 (GOWN DISPOSABLE) ×1 IMPLANT
GOWN STRL REUS W/ TWL XL LVL3 (GOWN DISPOSABLE) ×1 IMPLANT
GOWN STRL REUS W/TWL LRG LVL3 (GOWN DISPOSABLE) ×1
GOWN STRL REUS W/TWL XL LVL3 (GOWN DISPOSABLE) ×1
KNIFE 45D UP 2.3 (MISCELLANEOUS) ×2 IMPLANT
LABEL CATARACT MEDS ST (LABEL) ×2 IMPLANT
LENS IOL ACRSF MP 17.0 (Intraocular Lens) IMPLANT
LENS IOL ACRYSOF POST 17.0 (Intraocular Lens) IMPLANT
PACK CATARACT (MISCELLANEOUS) ×2 IMPLANT
PACK CATARACT KING (MISCELLANEOUS) ×2 IMPLANT
PACK EYE AFTER SURG (MISCELLANEOUS) ×2 IMPLANT
PACK VIT ANT 23G (MISCELLANEOUS) ×2 IMPLANT
SOL BSS BAG (MISCELLANEOUS) ×2
SOLUTION BSS BAG (MISCELLANEOUS) ×1 IMPLANT
SUTURE ETHILON 10-0 (SUTURE) ×2 IMPLANT
SYR 5ML LL (SYRINGE) ×2 IMPLANT
WATER STERILE IRR 250ML POUR (IV SOLUTION) ×2 IMPLANT
WIPE NON LINTING 3.25X3.25 (MISCELLANEOUS) ×2 IMPLANT

## 2019-01-13 NOTE — Anesthesia Postprocedure Evaluation (Signed)
Anesthesia Post Note  Patient: Christine Cruz  Procedure(s) Performed: CATARACT EXTRACTION PHACO AND INTRAOCULAR LENS PLACEMENT (IOC) RIGHT, vitrectomy (Right Eye)  Patient location during evaluation: PACU Anesthesia Type: MAC Level of consciousness: awake and alert Pain management: pain level controlled Vital Signs Assessment: post-procedure vital signs reviewed and stable Respiratory status: spontaneous breathing, nonlabored ventilation and respiratory function stable Cardiovascular status: stable and blood pressure returned to baseline Postop Assessment: no apparent nausea or vomiting Anesthetic complications: no     Last Vitals:  Vitals:   01/13/19 0623 01/13/19 0901  BP: (!) 149/80 (!) 111/54  Pulse: 65 (!) 59  Resp: 16 16  Temp: (!) 35.6 C (!) 35.9 C  SpO2: 97% 99%    Last Pain:  Vitals:   01/13/19 0901  TempSrc: Temporal  PainSc: 0-No pain                 Lavone Orn

## 2019-01-13 NOTE — Anesthesia Post-op Follow-up Note (Signed)
Anesthesia QCDR form completed.        

## 2019-01-13 NOTE — Anesthesia Preprocedure Evaluation (Signed)
Anesthesia Evaluation  Patient identified by MRN, date of birth, ID band Patient awake    Reviewed: Allergy & Precautions, NPO status , Patient's Chart, lab work & pertinent test results, reviewed documented beta blocker date and time   Airway Mallampati: II  TM Distance: >3 FB     Dental  (+) Chipped   Pulmonary former smoker,           Cardiovascular hypertension, Pt. on medications      Neuro/Psych    GI/Hepatic   Endo/Other    Renal/GU      Musculoskeletal   Abdominal   Peds  Hematology   Anesthesia Other Findings   Reproductive/Obstetrics                             Anesthesia Physical Anesthesia Plan  ASA: II  Anesthesia Plan: MAC   Post-op Pain Management:    Induction:   PONV Risk Score and Plan:   Airway Management Planned:   Additional Equipment:   Intra-op Plan:   Post-operative Plan:   Informed Consent: I have reviewed the patients History and Physical, chart, labs and discussed the procedure including the risks, benefits and alternatives for the proposed anesthesia with the patient or authorized representative who has indicated his/her understanding and acceptance.       Plan Discussed with: CRNA  Anesthesia Plan Comments:         Anesthesia Quick Evaluation

## 2019-01-13 NOTE — Discharge Instructions (Signed)
Eye Surgery Discharge Instructions    Expect mild scratchy sensation or mild soreness. DO NOT RUB YOUR EYE!  The day of surgery:  Minimal physical activity, but bed rest is not required  No reading, computer work, or close hand work  No bending, lifting, or straining.  May watch TV  For 24 hours:  No driving, legal decisions, or alcoholic beverages  Safety precautions  Eat anything you prefer: It is better to start with liquids, then soup then solid foods.  _____ Eye patch should be worn until postoperative exam tomorrow.  ____ Solar shield eyeglasses should be worn for comfort in the sunlight/patch while sleeping  Resume all regular medications including aspirin or Coumadin if these were discontinued prior to surgery. You may shower, bathe, shave, or wash your hair. Tylenol may be taken for mild discomfort.  Call your doctor if you experience significant pain, nausea, or vomiting, fever > 101 or other signs of infection. [phone] or [phone] Specific instructions:  Follow-up Information    Marchia Meiers, MD Follow up on 01/14/2019.   Specialty:  Ophthalmology Why:  appointment time @ 10:45 AM Contact information: [address] [place] [address] [phone]

## 2019-01-13 NOTE — Transfer of Care (Signed)
Immediate Anesthesia Transfer of Care Note  Patient: Christine Cruz  Procedure(s) Performed: CATARACT EXTRACTION PHACO AND INTRAOCULAR LENS PLACEMENT (IOC) RIGHT, vitrectomy (Right Eye)  Patient Location: PACU  Anesthesia Type:MAC  Level of Consciousness: awake  Airway & Oxygen Therapy: Patient Spontanous Breathing  Post-op Assessment: Report given to RN and Post -op Vital signs reviewed and stable  Post vital signs: stable  Last Vitals:  Vitals Value Taken Time  BP 111/54 01/13/2019  9:01 AM  Temp 35.9 C 01/13/2019  9:01 AM  Pulse 59 01/13/2019  9:01 AM  Resp 16 01/13/2019  9:01 AM  SpO2 99 % 01/13/2019  9:01 AM    Last Pain:  Vitals:   01/13/19 0901  TempSrc: Temporal  PainSc: 0-No pain         Complications: No apparent anesthesia complications

## 2019-01-13 NOTE — H&P (Signed)
   I have reviewed the patient's H&P and agree with its findings. There have been no interval changes.  Navaeh Kehres MD Ophthalmology 

## 2019-01-13 NOTE — Op Note (Addendum)
  PREOPERATIVE DIAGNOSIS:  Posterior subcapsular cataract of the RIGHT eye.   POSTOPERATIVE DIAGNOSIS:  Aphakia, RIGHT eye   OPERATIVE PROCEDURE: Cataract surgery OD   SURGEON:  Marchia Meiers, MD.   ANESTHESIA:  Anesthesiologist: Gunnar Bulla, MD CRNA: Lavone Orn, CRNA  1.      Managed anesthesia care. 2.     0.24ml of Shugarcaine was instilled following the paracentesis   COMPLICATIONS:  None.   TECHNIQUE:   Divide and conquer   DESCRIPTION OF PROCEDURE:  The patient was examined and consented in the preoperative holding area where the aforementioned topical anesthesia was applied to the RIGHT eye and then brought back to the Operating Room where the left eye was prepped and draped in the usual sterile ophthalmic fashion and a lid speculum was placed. A paracentesis was created with the side port blade, the anterior chamber was washed out with trypan blue to stain the anterior capsule, and the anterior chamber was filled with viscoelastic. A near clear corneal incision was performed with the steel keratome. A continuous curvilinear capsulorrhexis was performed with a cystotome followed by the capsulorrhexis forceps. Hydrodissection and hydrodelineation were carried out with BSS on a blunt cannula. The lens was removed in a divide and conquer  technique and the remaining cortical material was removed with the irrigation-aspiration handpiece.   At this point, a tear was noted in the posterior capsule.  After tamponading the vitreous with additional viscoelastic, an anterior vitrectomy was performed in the usual fashion.  A second sideport was created. Triescence was injected into the [facility], and vitreous in [facility] the removed as much as possible with the cut-IA vitrector settings. The main wound was closed with a 10-0 nylon suture.  Vitreous prolapsing from the wounds was excised with [place] scissors.  Because of questionable anterior capsular support, the decision was made to leave the  patient Aphakic for a surgery.  The anterior chamber was flushed and the eye was inflated to physiologic pressure. 0.44ml Vigamox was placed in the anterior chamber. The wounds were found to be water tight. The eye was dressed with Cosopt and Maxitrol ointment. The patient was given a shield with which to sleep tonight and instructions for what to take the next day. Implant Name Type Inv. Item Serial No. Manufacturer Lot No. LRB No. Used  LENS IOL POST 17.0 - [id number] Intraocular Lens LENS IOL POST 17.0 [id number] ALCON  Right 1    Procedure(s) with comments: CATARACT EXTRACTION PHACO AND INTRAOCULAR LENS PLACEMENT (IOC) RIGHT, vitrectomy (Right) - [place]    CDE Fluid pack lot # [id number]  Electronically signed: Marchia Meiers 01/13/2019 8:55 AM

## 2019-01-15 DIAGNOSIS — H2701 Aphakia, right eye: Secondary | ICD-10-CM | POA: Diagnosis not present

## 2019-01-15 DIAGNOSIS — H43813 Vitreous degeneration, bilateral: Secondary | ICD-10-CM | POA: Diagnosis not present

## 2019-01-15 DIAGNOSIS — H59021 Cataract (lens) fragments in eye following cataract surgery, right eye: Secondary | ICD-10-CM | POA: Diagnosis not present

## 2019-01-22 DIAGNOSIS — H43811 Vitreous degeneration, right eye: Secondary | ICD-10-CM | POA: Diagnosis not present

## 2019-01-22 DIAGNOSIS — H59021 Cataract (lens) fragments in eye following cataract surgery, right eye: Secondary | ICD-10-CM | POA: Diagnosis not present

## 2019-01-22 DIAGNOSIS — H2701 Aphakia, right eye: Secondary | ICD-10-CM | POA: Diagnosis not present

## 2019-01-23 DIAGNOSIS — H59021 Cataract (lens) fragments in eye following cataract surgery, right eye: Secondary | ICD-10-CM | POA: Diagnosis not present

## 2019-01-23 DIAGNOSIS — H2701 Aphakia, right eye: Secondary | ICD-10-CM | POA: Diagnosis not present

## 2019-04-02 DIAGNOSIS — H43812 Vitreous degeneration, left eye: Secondary | ICD-10-CM | POA: Diagnosis not present

## 2019-10-01 DIAGNOSIS — Z23 Encounter for immunization: Secondary | ICD-10-CM | POA: Diagnosis not present

## 2019-10-14 ENCOUNTER — Other Ambulatory Visit: Payer: Self-pay | Admitting: Internal Medicine

## 2019-10-14 DIAGNOSIS — I10 Essential (primary) hypertension: Secondary | ICD-10-CM

## 2019-10-14 MED ORDER — HYDROCHLOROTHIAZIDE 12.5 MG PO CAPS: 12.5000 mg | ORAL_CAPSULE | Freq: Every day | ORAL | 0 refills | Status: DC

## 2019-10-14 MED ORDER — LOSARTAN POTASSIUM 100 MG PO TABS: 100.0000 mg | ORAL_TABLET | Freq: Every day | ORAL | 0 refills | Status: DC

## 2019-10-16 ENCOUNTER — Other Ambulatory Visit: Payer: Self-pay | Admitting: Internal Medicine

## 2019-10-16 ENCOUNTER — Telehealth: Payer: Self-pay | Admitting: Internal Medicine

## 2019-10-16 DIAGNOSIS — I10 Essential (primary) hypertension: Secondary | ICD-10-CM

## 2019-10-16 NOTE — Telephone Encounter (Signed)
Copied from [place] [id number]. Topic: Quick Communication - Rx Refill/Question >> Oct 16, 2019  3:05 PM Izola Price, [place] A wrote: Medication: losartan (COZAAR) 100 MG tablet ,hydrochlorothiazide (MICROZIDE) 12.5 MG capsule (Patient stated that pharmacy needs medication to be sent over )  Has the patient contacted their pharmacy? Yes (Agent: If no, request that the patient contact the pharmacy for the refill.) (Agent: If yes, when and what did the pharmacy advise?)Contact PCP  Preferred Pharmacy (with phone number or street name[id number] [address], [place] [phone] (Phone) [phone] (Fax)    Agent: Please be advised that RX refills may take up to 3 business days. We ask that you follow-up with your pharmacy.

## 2019-10-16 NOTE — Telephone Encounter (Signed)
Call pt and sch f/u appt   [place]

## 2019-10-29 NOTE — Telephone Encounter (Signed)
Pt is scheduled on 01/29/2020 @ 8am

## 2020-01-13 ENCOUNTER — Other Ambulatory Visit: Payer: Self-pay | Admitting: Internal Medicine

## 2020-01-13 DIAGNOSIS — I10 Essential (primary) hypertension: Secondary | ICD-10-CM

## 2020-01-13 MED ORDER — LOSARTAN POTASSIUM 100 MG PO TABS: 100.0000 mg | ORAL_TABLET | Freq: Every day | ORAL | 0 refills | Status: DC

## 2020-01-13 MED ORDER — HYDROCHLOROTHIAZIDE 12.5 MG PO CAPS: 12.5000 mg | ORAL_CAPSULE | Freq: Every day | ORAL | 0 refills | Status: DC

## 2020-01-25 ENCOUNTER — Telehealth: Payer: Self-pay | Admitting: Internal Medicine

## 2020-01-25 DIAGNOSIS — I10 Essential (primary) hypertension: Secondary | ICD-10-CM

## 2020-01-25 MED ORDER — LOSARTAN POTASSIUM 100 MG PO TABS: 100.0000 mg | ORAL_TABLET | Freq: Every day | ORAL | 0 refills | Status: DC

## 2020-01-25 MED ORDER — HYDROCHLOROTHIAZIDE 12.5 MG PO CAPS: 12.5000 mg | ORAL_CAPSULE | Freq: Every day | ORAL | 0 refills | Status: DC

## 2020-01-25 NOTE — Telephone Encounter (Signed)
Pt needs refill on hydrochlorothiazide (MICROZIDE) 12.5 MG capsule and losartan (COZAAR) 100 MG tablet. Pt has appt on 01/29/20 but is out of these prescriptions. Please send over.

## 2020-01-28 ENCOUNTER — Other Ambulatory Visit: Payer: Self-pay

## 2020-01-29 ENCOUNTER — Encounter: Payer: Self-pay | Admitting: Internal Medicine

## 2020-01-29 ENCOUNTER — Ambulatory Visit (INDEPENDENT_AMBULATORY_CARE_PROVIDER_SITE_OTHER): Payer: MEDICARE | Admitting: Internal Medicine

## 2020-01-29 VITALS — BP 137/83 | Ht 61.0 in | Wt 160.0 lb

## 2020-01-29 DIAGNOSIS — E559 Vitamin D deficiency, unspecified: Secondary | ICD-10-CM

## 2020-01-29 DIAGNOSIS — R7303 Prediabetes: Secondary | ICD-10-CM

## 2020-01-29 DIAGNOSIS — E785 Hyperlipidemia, unspecified: Secondary | ICD-10-CM

## 2020-01-29 DIAGNOSIS — H9312 Tinnitus, left ear: Secondary | ICD-10-CM

## 2020-01-29 DIAGNOSIS — Z1329 Encounter for screening for other suspected endocrine disorder: Secondary | ICD-10-CM | POA: Diagnosis not present

## 2020-01-29 DIAGNOSIS — H269 Unspecified cataract: Secondary | ICD-10-CM | POA: Diagnosis not present

## 2020-01-29 DIAGNOSIS — Z1231 Encounter for screening mammogram for malignant neoplasm of breast: Secondary | ICD-10-CM

## 2020-01-29 DIAGNOSIS — Z1389 Encounter for screening for other disorder: Secondary | ICD-10-CM

## 2020-01-29 DIAGNOSIS — I10 Essential (primary) hypertension: Secondary | ICD-10-CM | POA: Diagnosis not present

## 2020-01-29 DIAGNOSIS — H9192 Unspecified hearing loss, left ear: Secondary | ICD-10-CM | POA: Diagnosis not present

## 2020-01-29 DIAGNOSIS — E2839 Other primary ovarian failure: Secondary | ICD-10-CM | POA: Diagnosis not present

## 2020-01-29 MED ORDER — HYDROCHLOROTHIAZIDE 12.5 MG PO CAPS: 12.5000 mg | ORAL_CAPSULE | Freq: Every day | ORAL | 3 refills | Status: DC

## 2020-01-29 MED ORDER — LOSARTAN POTASSIUM 100 MG PO TABS: 100.0000 mg | ORAL_TABLET | Freq: Every day | ORAL | 3 refills | Status: DC

## 2020-01-29 NOTE — Progress Notes (Signed)
Virtual Visit via Video Note  I connected with Christine Cruz  on 01/29/20 at  8:15 AM EST by a video enabled telemedicine application and verified that I am speaking with the correct person using two identifiers.  Location patient: home Location provider:work or home office Persons participating in the virtual visit: patient, provider  I discussed the limitations of evaluation and management by telemedicine and the availability of in person appointments. The patient expressed understanding and agreed to proceed.   HPI: 1. HTN BP 137/83 today on losartan 100 and hctz 12.5 mg qd BP range 120s-130s/70-80s 2. HLD off statin will repeat labs, prediabetes check labs  3. 01/2019 cataract surgery on the right eye and lens shattered had glass in back of eye and had to have 2nd surgery 5 weeks later vision now is getting back to normal almost 1 year latter  Left eye still with cataract   ROS: See pertinent positives and negatives per HPI. Denies CP, sob  Left ear hearing loss   Past Medical History:  Diagnosis Date  . Essential familial hypercholesterolemia   . History of blood transfusion   . History of hypertension   . Hypertension   . Menopause   . Osteopenia   . Osteoporosis     Past Surgical History:  Procedure Laterality Date  . ABDOMINAL HYSTERECTOMY  1999  . BREAST EXCISIONAL BIOPSY  1966  . CATARACT EXTRACTION W/PHACO Right 01/13/2019   Procedure: CATARACT EXTRACTION PHACO AND INTRAOCULAR LENS PLACEMENT (IOC) RIGHT, vitrectomy;  Surgeon: Marchia Meiers, MD;  Location: [facility] ORS;  Service: Ophthalmology;  Laterality: Right;  [place]    CDE Fluid pack lot # [id number]  . COLONOSCOPY WITH PROPOFOL N/A 05/23/2018   Procedure: COLONOSCOPY WITH PROPOFOL;  Surgeon: Lollie Sails, MD;  Location[facility] ENDOSCOPY;  Service: Endoscopy;  Laterality: N/A;  . TONSILLECTOMY  1965  . TUBAL LIGATION      Family History  Problem Relation Age of Onset  . Arthritis Mother   . Heart disease Mother    . Stroke Mother   . Hypertension Mother   . Kidney disease Mother   . Diabetes Mother   . Breast cancer Sister 22    SOCIAL HX: married    Current Outpatient Medications:  .  Ascorbic Acid (VITAMIN C PO), Take 1 tablet by mouth daily., Disp: , Rfl:  .  Cholecalciferol (VITAMIN D-3) 125 MCG (5000 UT) TABS, Take by mouth., Disp: , Rfl:  .  hydrochlorothiazide (MICROZIDE) 12.5 MG capsule, Take 1 capsule (12.5 mg total) by mouth daily., Disp: 90 capsule, Rfl: 3 .  losartan (COZAAR) 100 MG tablet, Take 1 tablet (100 mg total) by mouth daily., Disp: 90 tablet, Rfl: 3 .  Omega-3 Fatty Acids (OMEGA 3 PO), Take 1 capsule by mouth 2 (two) times daily., Disp: , Rfl:  .  OVER THE COUNTER MEDICATION, Place 1 drop into both eyes daily. Can-C Eye Drop, Disp: , Rfl:   EXAM:  VITALS per patient if applicable:  GENERAL: alert, oriented, appears well and in no acute distress  HEENT: atraumatic, conjunttiva clear, no obvious abnormalities on inspection of external nose and ears  NECK: normal movements of the head and neck  LUNGS: on inspection no signs of respiratory distress, breathing rate appears normal, no obvious gross SOB, gasping or wheezing  CV: no obvious cyanosis  MS: moves all visible extremities without noticeable abnormality  PSYCH/NEURO: pleasant and cooperative, no obvious depression or anxiety, speech and thought processing grossly intact  ASSESSMENT AND  PLAN:  Discussed the following assessment and plan:  Essential hypertension - Plan: losartan (COZAAR) 100 MG tablet, hydrochlorothiazide (MICROZIDE) 12.5 MG capsule, Comprehensive metabolic panel, Lipid panel, CBC with Differential/Platelet  Screening mammogram, encounter for - Plan: MM 3D SCREEN BREAST BILATERAL  Hyperlipidemia, unspecified hyperlipidemia type  Prediabetes - Plan: Hemoglobin A1c  Hearing loss of left ear, unspecified hearing loss type  Tinnitus, left  Cataract of left eye, unspecified cataract  type  Thyroid disorder screen - Plan: TSH  Screening for blood or protein in urine - Plan: Urinalysis, Routine w reflex microscopic  Vitamin D deficiency - Plan: Vitamin D (25 hydroxy) Flu shot utd 09/2019 covid vx 1/2 getting 01/30/20 Tdap rec will disc again at f/u with shingrix, prevnar and pna 23  Declines Hep B/C/HIV   [facility] referred h/o 03/2016 colonospopy tubular/tubullovillous adenoma sch [facility]  -05/27/18 [facility] colonoscopy tubular adenoma x 2 neg high grade dysplasia f/u in 3 years   mammo referred [place] pt to sch  Pap get [facility] appt  Disc dexa at f/uage 69 y.o   [place] [facility] 01/24/2019 aphakia right eye cataract right eye s/p surgery given pred phos, gatifloxacin, bromfenac qid and Durezol bid Dr. Marchia Meiers   -we discussed possible serious and likely etiologies, options for evaluation and workup, limitations of telemedicine visit vs in person visit, treatment, treatment risks and precautions. Pt prefers to treat via telemedicine empirically rather then risking or undertaking an in person visit at this moment. Patient agrees to seek prompt in person care if worsening, new symptoms arise, or if is not improving with treatment.   I discussed the assessment and treatment plan with the patient. The patient was provided an opportunity to ask questions and all were answered. The patient agreed with the plan and demonstrated an understanding of the instructions.   The patient was advised to call back or seek an in-person evaluation if the symptoms worsen or if the condition fails to improve as anticipated.  Time spent 20 minutes Delorise Jackson, MD

## 2020-01-29 NOTE — Patient Instructions (Addendum)
Call [facility] to schedule mammogram and bone density to see how strong your bones are    Tinnitus Tinnitus refers to hearing a sound when there is no actual source for that sound. This is often described as ringing in the ears. However, people with this condition may hear a variety of noises, in one ear or in both ears. The sounds of tinnitus can be soft, loud, or somewhere in between. Tinnitus can last for a few seconds or can be constant for days. It may go away without treatment and come back at various times. When tinnitus is constant or happens often, it can lead to other problems, such as trouble sleeping and trouble concentrating. Almost everyone experiences tinnitus at some point. Tinnitus that is long-lasting (chronic) or comes back often (recurs) may require medical attention. What are the causes? The cause of tinnitus is often not known. In some cases, it can result from:  Exposure to loud noises from machinery, music, or other sources.  An object (foreign body) stuck in the ear.  Earwax buildup.  Drinking alcohol or caffeine.  Taking certain medicines.  Age-related hearing loss. It may also be caused by medical conditions such as:  Ear or sinus infections.  High blood pressure.  Heart diseases.  Anemia.  Allergies.  Meniere's disease.  Thyroid problems.  Tumors.  A weak, bulging blood vessel (aneurysm) near the ear. What are the signs or symptoms? The main symptom of tinnitus is hearing a sound when there is no source for that sound. It may sound like:  Buzzing.  Roaring.  Ringing.  Blowing air.  Hissing.  Whistling.  Sizzling.  Humming.  Running water.  A musical note.  Tapping. Symptoms may affect only one ear (unilateral) or both ears (bilateral). How is this diagnosed? Tinnitus is diagnosed based on your symptoms, your medical history, and a physical exam. Your health care provider may do a thorough hearing test (audiologic exam) if  your tinnitus:  Is unilateral.  Causes hearing difficulties.  Lasts 6 months or longer. You may work with a health care provider who specializes in hearing disorders (audiologist). You may be asked questions about your symptoms and how they affect your daily life. You may have other tests done, such as:  CT scan.  MRI.  An imaging test of how blood flows through your blood vessels (angiogram). How is this treated? Treating an underlying medical condition can sometimes make tinnitus go away. If your tinnitus continues, other treatments may include:  Medicines.  Therapy and counseling to help you manage the stress of living with tinnitus.  Sound generators to mask the tinnitus. These include: ? Tabletop sound machines that play relaxing sounds to help you fall asleep. ? Wearable devices that fit in your ear and play sounds or music. ? Acoustic neural stimulation. This involves using headphones to listen to music that contains an auditory signal. Over time, listening to this signal may change some pathways in your brain and make you less sensitive to tinnitus. This treatment is used for very severe cases when no other treatment is working.  Using hearing aids or cochlear implants if your tinnitus is related to hearing loss. Hearing aids are worn in the outer ear. Cochlear implants are surgically placed in the inner ear. Follow these instructions at home: Managing symptoms      When possible, avoid being in loud places and being exposed to loud sounds.  Wear hearing protection, such as earplugs, when you are exposed to loud noises.  Use a white noise machine, a humidifier, or other devices to mask the sound of tinnitus.  Practice techniques for reducing stress, such as meditation, yoga, or deep breathing. Work with your health care provider if you need help with managing stress.  Sleep with your head slightly raised. This may reduce the impact of tinnitus. General  instructions  Do not use stimulants, such as nicotine, alcohol, or caffeine. Talk with your health care provider about other stimulants to avoid. Stimulants are substances that can make you feel alert and attentive by increasing certain activities in the body (such as heart rate and blood pressure). These substances may make tinnitus worse.  Take over-the-counter and prescription medicines only as told by your health care provider.  Try to get plenty of sleep each night.  Keep all follow-up visits as told by your health care provider. This is important. Contact a health care provider if:  Your tinnitus continues for 3 weeks or longer without stopping.  You develop sudden hearing loss.  Your symptoms get worse or do not get better with home care.  You feel you are not able to manage the stress of living with tinnitus. Get help right away if:  You develop tinnitus after a head injury.  You have tinnitus along with any of the following: ? Dizziness. ? Loss of balance. ? Nausea and vomiting. ? Sudden, severe headache. These symptoms may represent a serious problem that is an emergency. Do not wait to see if the symptoms will go away. Get medical help right away. Call your local emergency services (911 in the [place] Do not drive yourself to the hospital. Summary  Tinnitus refers to hearing a sound when there is no actual source for that sound. This is often described as ringing in the ears.  Symptoms may affect only one ear (unilateral) or both ears (bilateral).  Use a white noise machine, a humidifier, or other devices to mask the sound of tinnitus.  Do not use stimulants, such as nicotine, alcohol, or caffeine. Talk with your health care provider about other stimulants to avoid. These substances may make tinnitus worse. This information is not intended to replace advice given to you by your health care provider. Make sure you discuss any questions you have with your health care  provider. Document Revised: 06/17/2019 Document Reviewed: 09/12/2017 Elsevier Patient Education  2020 [employer].

## 2020-01-30 ENCOUNTER — Ambulatory Visit: Payer: MEDICARE | Attending: Internal Medicine

## 2020-01-30 DIAGNOSIS — Z23 Encounter for immunization: Secondary | ICD-10-CM | POA: Insufficient documentation

## 2020-01-30 NOTE — Progress Notes (Signed)
   Covid-19 Vaccination Clinic  Name:  Christine Cruz    MRN: [id number] DOB: 08/21/1951  01/30/2020  [name] was observed post Covid-19 immunization for 15 minutes without incidence. She was provided with Vaccine Information Sheet and instruction to access the V-Safe system.   [name] was instructed to call 911 with any severe reactions post vaccine: Marland Kitchen Difficulty breathing  . Swelling of your face and throat  . A fast heartbeat  . A bad rash all over your body  . Dizziness and weakness    Immunizations Administered    Name Date Dose VIS Date Route   Pfizer COVID-19 Vaccine 01/30/2020  8:55 AM 0.3 mL 11/27/2019 Intramuscular   Manufacturer: [place]   Lot: [id number]   [place]: [id number]

## 2020-02-20 ENCOUNTER — Ambulatory Visit: Payer: MEDICARE | Attending: Internal Medicine

## 2020-02-20 DIAGNOSIS — Z23 Encounter for immunization: Secondary | ICD-10-CM | POA: Insufficient documentation

## 2020-02-20 NOTE — Progress Notes (Signed)
   Covid-19 Vaccination Clinic  Name:  Christine Cruz    MRN: [id number] DOB: May 28, 1951  02/20/2020  [name] was observed post Covid-19 immunization for 15 minutes without incident. She was provided with Vaccine Information Sheet and instruction to access the V-Safe system.   [name] was instructed to call 911 with any severe reactions post vaccine: Marland Kitchen Difficulty breathing  . Swelling of face and throat  . A fast heartbeat  . A bad rash all over body  . Dizziness and weakness   Immunizations Administered    Name Date Dose VIS Date Route   Pfizer COVID-19 Vaccine 02/20/2020  9:14 AM 0.3 mL 11/27/2019 Intramuscular   Manufacturer: [place]   Lot: [id number]   [place]: [id number]

## 2020-03-08 ENCOUNTER — Encounter: Payer: Self-pay | Admitting: Internal Medicine

## 2020-03-29 ENCOUNTER — Other Ambulatory Visit (INDEPENDENT_AMBULATORY_CARE_PROVIDER_SITE_OTHER): Payer: MEDICARE

## 2020-03-29 ENCOUNTER — Other Ambulatory Visit: Payer: Self-pay

## 2020-03-29 DIAGNOSIS — E559 Vitamin D deficiency, unspecified: Secondary | ICD-10-CM

## 2020-03-29 DIAGNOSIS — I10 Essential (primary) hypertension: Secondary | ICD-10-CM | POA: Diagnosis not present

## 2020-03-29 DIAGNOSIS — Z1389 Encounter for screening for other disorder: Secondary | ICD-10-CM

## 2020-03-29 DIAGNOSIS — Z1329 Encounter for screening for other suspected endocrine disorder: Secondary | ICD-10-CM

## 2020-03-29 DIAGNOSIS — R7303 Prediabetes: Secondary | ICD-10-CM

## 2020-03-29 LAB — CBC WITH DIFFERENTIAL/PLATELET
Basophils Absolute: 0 10*3/uL (ref 0.0–0.1)
Basophils Relative: 0.4 % (ref 0.0–3.0)
Eosinophils Absolute: 0.2 10*3/uL (ref 0.0–0.7)
Eosinophils Relative: 2.8 % (ref 0.0–5.0)
HCT: 42.4 % (ref 36.0–46.0)
Hemoglobin: 14.3 g/dL (ref 12.0–15.0)
Lymphocytes Relative: 36.1 % (ref 12.0–46.0)
Lymphs Abs: 2.1 10*3/uL (ref 0.7–4.0)
MCHC: 33.7 g/dL (ref 30.0–36.0)
MCV: 92.6 fl (ref 78.0–100.0)
Monocytes Absolute: 0.5 10*3/uL (ref 0.1–1.0)
Monocytes Relative: 8.7 % (ref 3.0–12.0)
Neutro Abs: 3 10*3/uL (ref 1.4–7.7)
Neutrophils Relative %: 52 % (ref 43.0–77.0)
Platelets: 174 10*3/uL (ref 150.0–400.0)
RBC: 4.58 Mil/uL (ref 3.87–5.11)
RDW: 13.3 % (ref 11.5–15.5)
WBC: 5.7 10*3/uL (ref 4.0–10.5)

## 2020-03-29 LAB — COMPREHENSIVE METABOLIC PANEL
ALT: 16 U/L (ref 0–35)
AST: 17 U/L (ref 0–37)
Albumin: 4.6 g/dL (ref 3.5–5.2)
Alkaline Phosphatase: 58 U/L (ref 39–117)
BUN: 12 mg/dL (ref 6–23)
CO2: 27 mEq/L (ref 19–32)
Calcium: 9.6 mg/dL (ref 8.4–10.5)
Chloride: 103 mEq/L (ref 96–112)
Creatinine, Ser: 0.9 mg/dL (ref 0.40–1.20)
GFR: 75.61 mL/min (ref >60.00)
Glucose, Bld: 162 mg/dL — ABNORMAL HIGH (ref 70–99)
Potassium: 3.4 mEq/L — ABNORMAL LOW (ref 3.5–5.1)
Sodium: 140 mEq/L (ref 135–145)
Total Bilirubin: 0.7 mg/dL (ref 0.2–1.2)
Total Protein: 7.2 g/dL (ref 6.0–8.3)

## 2020-03-29 LAB — TSH: TSH: 2.75 u[IU]/mL (ref 0.35–4.50)

## 2020-03-29 LAB — URINALYSIS, ROUTINE W REFLEX MICROSCOPIC
Bilirubin Urine: NEGATIVE
Glucose, UA: NEGATIVE
Hgb urine dipstick: NEGATIVE
Hyaline Cast: NONE SEEN /LPF
Ketones, ur: NEGATIVE
Nitrite: NEGATIVE
Protein, ur: NEGATIVE
Specific Gravity, Urine: 1.018 (ref 1.001–1.03)
pH: 5.5 (ref 5.0–8.0)

## 2020-03-29 LAB — LIPID PANEL
Cholesterol: 285 mg/dL — ABNORMAL HIGH (ref 0–200)
HDL: 64.3 mg/dL (ref >39.00)
LDL Cholesterol: 194 mg/dL — ABNORMAL HIGH (ref 0–99)
NonHDL: 221.09
Total CHOL/HDL Ratio: 4
Triglycerides: 135 mg/dL (ref 0.0–149.0)
VLDL: 27 mg/dL (ref 0.0–40.0)

## 2020-03-29 LAB — VITAMIN D 25 HYDROXY (VIT D DEFICIENCY, FRACTURES): VITD: 41.73 ng/mL (ref 30.00–100.00)

## 2020-03-29 LAB — HEMOGLOBIN A1C: Hgb A1c MFr Bld: 6.5 % (ref 4.6–6.5)

## 2020-04-01 ENCOUNTER — Other Ambulatory Visit: Payer: Self-pay | Admitting: Internal Medicine

## 2020-04-01 DIAGNOSIS — I10 Essential (primary) hypertension: Secondary | ICD-10-CM

## 2020-04-01 DIAGNOSIS — E119 Type 2 diabetes mellitus without complications: Secondary | ICD-10-CM

## 2020-04-01 DIAGNOSIS — E785 Hyperlipidemia, unspecified: Secondary | ICD-10-CM

## 2020-04-12 ENCOUNTER — Ambulatory Visit
Admission: RE | Admit: 2020-04-12 | Discharge: 2020-04-12 | Disposition: A | Discharge status: Home / Self Care | Payer: MEDICARE | Source: Ambulatory Visit | Attending: Internal Medicine | Admitting: Internal Medicine

## 2020-04-12 DIAGNOSIS — Z1231 Encounter for screening mammogram for malignant neoplasm of breast: Secondary | ICD-10-CM | POA: Diagnosis not present

## 2020-04-12 DIAGNOSIS — E2839 Other primary ovarian failure: Secondary | ICD-10-CM | POA: Diagnosis not present

## 2020-04-12 DIAGNOSIS — M8589 Other specified disorders of bone density and structure, multiple sites: Secondary | ICD-10-CM | POA: Diagnosis not present

## 2020-04-12 DIAGNOSIS — Z780 Asymptomatic menopausal state: Secondary | ICD-10-CM | POA: Diagnosis not present

## 2020-04-18 ENCOUNTER — Other Ambulatory Visit: Payer: Self-pay

## 2020-04-18 ENCOUNTER — Encounter: Payer: Self-pay | Admitting: *Deleted

## 2020-04-18 ENCOUNTER — Encounter: Payer: MEDICARE | Attending: Internal Medicine | Admitting: *Deleted

## 2020-04-18 VITALS — BP 156/90 | Ht 62.0 in | Wt 159.6 lb

## 2020-04-18 DIAGNOSIS — E119 Type 2 diabetes mellitus without complications: Secondary | ICD-10-CM

## 2020-04-18 NOTE — Progress Notes (Signed)
Diabetes Self-Management Education  Visit Type: First/Initial  Appt. Start Time: 1530 Appt. End Time: [id number]  04/18/2020  [name], identified by name and date of birth, is a 69 y.o. female with a diagnosis of Diabetes: Type 2.   ASSESSMENT  Blood pressure (!) 156/90, height 5\' 2"  (1.575 m), weight 159 lb 9.6 oz (72.4 kg). Body mass index is 29.19 kg/m.  Diabetes Self-Management Education - 04/18/20 1805      Visit Information   Visit Type  First/Initial      Initial Visit   Diabetes Type  Type 2    Are you currently following a meal plan?  Yes    What type of meal plan do you follow?  "eliminated coffee, minimized past, rice, bread, sweets"    Are you taking your medications as prescribed?  Yes    Date Diagnosed  April 2021      Health Coping   How would you rate your overall health?  Fair      Psychosocial Assessment   Patient Belief/Attitude about Diabetes  Motivated to manage diabetes   "ready to get to work"   Self-care barriers  None    Self-management support  Doctor's office;Family    Patient Concerns  Nutrition/Meal planning;Monitoring;Glycemic Control;Weight Control;Healthy Lifestyle    Special Needs  None    Preferred Learning Style  Auditory;Visual    Learning Readiness  Change in progress    How often do you need to have someone help you when you read instructions, pamphlets, or other written materials from your doctor or pharmacy?  1 - Never      Pre-Education Assessment   Patient understands the diabetes disease and treatment process.  Needs Instruction    Patient understands incorporating nutritional management into lifestyle.  Needs Instruction    Patient undertands incorporating physical activity into lifestyle.  Needs Instruction    Patient understands using medications safely.  Needs Instruction    Patient understands monitoring blood glucose, interpreting and using results  Needs Instruction    Patient understands prevention, detection, and  treatment of acute complications.  Needs Instruction    Patient understands prevention, detection, and treatment of chronic complications.  Needs Instruction    Patient understands how to develop strategies to address psychosocial issues.  Needs Instruction    Patient understands how to develop strategies to promote health/change behavior.  Needs Instruction      Complications   Last HgB A1C per patient/outside source  6.5 %   03/29/2020   How often do you check your blood sugar?  0 times/day (not testing)   Provided One Touch Verio Reflect meter and instructed on use. BG upon return demonstration was 129 mg/dL at 4:30 pm - 2 hrs pp.   Have you had a dilated eye exam in the past 12 months?  No    Have you had a dental exam in the past 12 months?  No    Are you checking your feet?  No      Dietary Intake   Breakfast  egg with peppers, onions, lettuce; oatmeal with blueberries; cream of wheat with blue berries    Snack (morning)  carrots, apple, orange, almonds or walnuts    Lunch  "eats more like vegetarian" stir fry veggies - mushrooms, onions, broccoli, zucchini, carrots; tuna with cilantro, peppers, tomatoes; salads    Snack (afternoon)  same as morning    Dinner  supper like lunch - salmon and veggies, chicken, veggie spaghetti, peas, corn,  beans, sweet potato, soup, cuccumbers, celery, cabbage, kale, cauliflower    Snack (evening)  same as morning    Beverage(s)  water, green tea      Exercise   Exercise Type  ADL's      Patient Education   Previous Diabetes Education  No    Disease state   Definition of diabetes, type 1 and 2, and the diagnosis of diabetes;Factors that contribute to the development of diabetes    Nutrition management   Role of diet in the treatment of diabetes and the relationship between the three main macronutrients and blood glucose level;Food label reading, portion sizes and measuring food.;Reviewed blood glucose goals for pre and post meals and how to evaluate  the patients' food intake on their blood glucose level.    Physical activity and exercise   Role of exercise on diabetes management, blood pressure control and cardiac health.    Monitoring  Taught/evaluated SMBG meter.;Purpose and frequency of SMBG.;Taught/discussed recording of test results and interpretation of SMBG.;Identified appropriate SMBG and/or A1C goals.    Chronic complications  Relationship between chronic complications and blood glucose control    Psychosocial adjustment  Identified and addressed patients feelings and concerns about diabetes      Individualized Goals (developed by patient)   Reducing Risk  Other (comment)   improve blood sugars, prevent diabetes complications, lose weight, lead a healthier lifestyle, become more fit     Outcomes   Expected Outcomes  Demonstrated interest in learning. Expect positive outcomes    Future DMSE  4-6 wks       Individualized Plan for Diabetes Self-Management Training:   Learning Objective:  Patient will have a greater understanding of diabetes self-management. Patient education plan is to attend individual and/or group sessions per assessed needs and concerns.   Plan:   Patient Instructions  Check blood sugars 2 x day before breakfast and 2 hrs after one meal 3-4 x week Bring blood sugar records to the next appointment Call your doctor for a prescription for:  1. Meter strips (type) One Touch Verio        checking  3-4  times per day  2. Lancets (type) One Touch Delica Plus     checking  3-4  times per day Exercise: Begin walking for 10-15 minutes  3  days a week and gradually increase to 30 minutes 5 x week Eat 3 meals day,  2 snacks a day Space meals 4-6 hours apart Allow 2-3 hours between meals and snacks Complete 3 Day Food Record and bring to next appt Make an eye doctor appointment Return for appointment on: Friday May 20, 2020 at 9:00 am with [facility] (dietitian)  Expected Outcomes:  Demonstrated interest in learning.  Expect positive outcomes  Education material provided: General Meal Planning Guidelines Simple Meal Plan Meter - One Touch Verio Reflect  If problems or questions, patient to contact team via:  Johny Drilling, RN, [place] [phone]  Future DSME appointment: 4-6 wks  May 20, 2020 with the dietitian

## 2020-04-18 NOTE — Patient Instructions (Addendum)
Check blood sugars 2 x day before breakfast and 2 hrs after one meal 3-4 x week Bring blood sugar records to the next appointment  Call your doctor for a prescription for:  1. Meter strips (type) One Touch Verio        checking  3-4  times per day  2. Lancets (type) One Touch Delica Plus     checking  3-4  times per day  Exercise: Begin walking for 10-15 minutes  3  days a week and gradually increase to 30 minutes 5 x week  Eat 3 meals day,  2 snacks a day Space meals 4-6 hours apart Allow 2-3 hours between meals and snacks Complete 3 Day Food Record and bring to next appt  Make an eye doctor appointment  Return for appointment on: Friday May 20, 2020 at 9:00 am with [facility] (dietitian)

## 2020-05-17 ENCOUNTER — Telehealth: Payer: Self-pay | Admitting: *Deleted

## 2020-05-17 NOTE — Telephone Encounter (Signed)
Patient left message with front desk that she needed to cancel her appt for Friday. Called her back and she reports that she needs to be out of town. She reports that she will call back to reschedule.

## 2020-05-20 ENCOUNTER — Ambulatory Visit: Payer: MEDICARE | Admitting: Dietician

## 2020-06-14 ENCOUNTER — Encounter: Payer: Self-pay | Admitting: *Deleted

## 2020-07-19 ENCOUNTER — Other Ambulatory Visit: Payer: Self-pay

## 2020-07-20 ENCOUNTER — Ambulatory Visit (INDEPENDENT_AMBULATORY_CARE_PROVIDER_SITE_OTHER): Payer: MEDICARE | Admitting: Internal Medicine

## 2020-07-20 ENCOUNTER — Encounter: Payer: Self-pay | Admitting: Internal Medicine

## 2020-07-20 ENCOUNTER — Ambulatory Visit
Admission: RE | Admit: 2020-07-20 | Discharge: 2020-07-20 | Disposition: A | Discharge status: Home / Self Care | Payer: Medicare Other | Source: Ambulatory Visit | Attending: Internal Medicine | Admitting: Internal Medicine

## 2020-07-20 ENCOUNTER — Other Ambulatory Visit: Payer: Self-pay

## 2020-07-20 ENCOUNTER — Ambulatory Visit (INDEPENDENT_AMBULATORY_CARE_PROVIDER_SITE_OTHER): Payer: MEDICARE

## 2020-07-20 VITALS — BP 150/96 | HR 107 | Temp 98.2°F | Ht 62.0 in | Wt 152.2 lb

## 2020-07-20 DIAGNOSIS — I10 Essential (primary) hypertension: Secondary | ICD-10-CM | POA: Diagnosis not present

## 2020-07-20 DIAGNOSIS — M25562 Pain in left knee: Secondary | ICD-10-CM | POA: Diagnosis not present

## 2020-07-20 DIAGNOSIS — I152 Hypertension secondary to endocrine disorders: Secondary | ICD-10-CM

## 2020-07-20 DIAGNOSIS — M7989 Other specified soft tissue disorders: Secondary | ICD-10-CM | POA: Diagnosis not present

## 2020-07-20 DIAGNOSIS — M79605 Pain in left leg: Secondary | ICD-10-CM | POA: Diagnosis not present

## 2020-07-20 DIAGNOSIS — M1612 Unilateral primary osteoarthritis, left hip: Secondary | ICD-10-CM | POA: Diagnosis not present

## 2020-07-20 DIAGNOSIS — S76212A Strain of adductor muscle, fascia and tendon of left thigh, initial encounter: Secondary | ICD-10-CM | POA: Diagnosis not present

## 2020-07-20 DIAGNOSIS — E1159 Type 2 diabetes mellitus with other circulatory complications: Secondary | ICD-10-CM

## 2020-07-20 DIAGNOSIS — M79662 Pain in left lower leg: Secondary | ICD-10-CM | POA: Diagnosis not present

## 2020-07-20 DIAGNOSIS — R1032 Left lower quadrant pain: Secondary | ICD-10-CM

## 2020-07-20 DIAGNOSIS — E663 Overweight: Secondary | ICD-10-CM | POA: Diagnosis not present

## 2020-07-20 MED ORDER — LOSARTAN POTASSIUM-HCTZ 100-12.5 MG PO TABS: 1.0000 | ORAL_TABLET | Freq: Every day | ORAL | 3 refills | Status: DC

## 2020-07-20 MED ORDER — AMLODIPINE BESYLATE 5 MG PO TABS: 5.0000 mg | ORAL_TABLET | Freq: Every day | ORAL | 3 refills | Status: DC

## 2020-07-20 NOTE — Patient Instructions (Addendum)
Lidocaine pain patch  Orthopedic doctors  -Dr.[facility]  -Dr. Roland Rack or Dr[facility]   Add norvasc 5 mg daily for high blood pressure     DASH Eating Plan DASH stands for "Dietary Approaches to Stop Hypertension." The DASH eating plan is a healthy eating plan that has been shown to reduce high blood pressure (hypertension). It may also reduce your risk for type 2 diabetes, heart disease, and stroke. The DASH eating plan may also help with weight loss. What are tips for following this plan?  General guidelines  Avoid eating more than 2,300 mg (milligrams) of salt (sodium) a day. If you have hypertension, you may need to reduce your sodium intake to 1,500 mg a day.  Limit alcohol intake to no more than 1 drink a day for nonpregnant women and 2 drinks a day for men. One drink equals 12 oz of beer, 5 oz of wine, or 1 oz of hard liquor.  Work with your health care provider to maintain a healthy body weight or to lose weight. Ask what an ideal weight is for you.  Get at least 30 minutes of exercise that causes your heart to beat faster (aerobic exercise) most days of the week. Activities may include walking, swimming, or biking.  Work with your health care provider or diet and nutrition specialist (dietitian) to adjust your eating plan to your individual calorie needs. Reading food labels   Check food labels for the amount of sodium per serving. Choose foods with less than 5 percent of the Daily Value of sodium. Generally, foods with less than 300 mg of sodium per serving fit into this eating plan.  To find whole grains, look for the word "whole" as the first word in the ingredient list. Shopping  Buy products labeled as "low-sodium" or "no salt added."  Buy fresh foods. Avoid canned foods and premade or frozen meals. Cooking  Avoid adding salt when cooking. Use salt-free seasonings or herbs instead of table salt or sea salt. Check with your health care provider  or pharmacist before using salt substitutes.  Do not fry foods. Cook foods using healthy methods such as baking, boiling, grilling, and broiling instead.  Cook with heart-healthy oils, such as olive, canola, soybean, or sunflower oil. Meal planning  Eat a balanced diet that includes: ? 5 or more servings of fruits and vegetables each day. At each meal, try to fill half of your plate with fruits and vegetables. ? Up to 6-8 servings of whole grains each day. ? Less than 6 oz of lean meat, poultry, or fish each day. A 3-oz serving of meat is about the same size as a deck of cards. One egg equals 1 oz. ? 2 servings of low-fat dairy each day. ? A serving of nuts, seeds, or beans 5 times each week. ? Heart-healthy fats. Healthy fats called Omega-3 fatty acids are found in foods such as flaxseeds and coldwater fish, like sardines, salmon, and mackerel.  Limit how much you eat of the following: ? Canned or prepackaged foods. ? Food that is high in trans fat, such as fried foods. ? Food that is high in saturated fat, such as fatty meat. ? Sweets, desserts, sugary drinks, and other foods with added sugar. ? Full-fat dairy products.  Do not salt foods before eating.  Try to eat at least 2 vegetarian meals each week.  Eat more home-cooked food and less restaurant, buffet, and fast food.  When eating at a  restaurant, ask that your food be prepared with less salt or no salt, if possible. What foods are recommended? The items listed may not be a complete list. Talk with your dietitian about what dietary choices are best for you. Grains Whole-grain or whole-wheat bread. Whole-grain or whole-wheat pasta. Brown rice. [place]. Bulgur. Whole-grain and low-sodium cereals. Pita bread. Low-fat, low-sodium crackers. Whole-wheat flour tortillas. Vegetables Fresh or frozen vegetables (raw, steamed, roasted, or grilled). Low-sodium or reduced-sodium tomato and vegetable juice. Low-sodium or  reduced-sodium tomato sauce and tomato paste. Low-sodium or reduced-sodium canned vegetables. Fruits All fresh, dried, or frozen fruit. Canned fruit in natural juice (without added sugar). Meat and other protein foods Skinless chicken or [place]. Ground chicken or [place]. Pork with fat trimmed off. Fish and seafood. Egg whites. Dried beans, peas, or lentils. Unsalted nuts, nut butters, and seeds. Unsalted canned beans. Lean cuts of beef with fat trimmed off. Low-sodium, lean deli meat. Dairy Low-fat (1%) or fat-free (skim) milk. Fat-free, low-fat, or reduced-fat cheeses. Nonfat, low-sodium ricotta or cottage cheese. Low-fat or nonfat yogurt. Low-fat, low-sodium cheese. Fats and oils Soft margarine without trans fats. Vegetable oil. Low-fat, reduced-fat, or light mayonnaise and salad dressings (reduced-sodium). Canola, safflower, olive, soybean, and sunflower oils. Avocado. Seasoning and other foods Herbs. Spices. Seasoning mixes without salt. Unsalted popcorn and pretzels. Fat-free sweets. What foods are not recommended? The items listed may not be a complete list. Talk with your dietitian about what dietary choices are best for you. Grains Baked goods made with fat, such as croissants, muffins, or some breads. Dry pasta or rice meal packs. Vegetables Creamed or fried vegetables. Vegetables in a cheese sauce. Regular canned vegetables (not low-sodium or reduced-sodium). Regular canned tomato sauce and paste (not low-sodium or reduced-sodium). Regular tomato and vegetable juice (not low-sodium or reduced-sodium). Angie Fava. Olives. Fruits Canned fruit in a light or heavy syrup. Fried fruit. Fruit in cream or butter sauce. Meat and other protein foods Fatty cuts of meat. Ribs. Fried meat. Berniece Salines. Sausage. Bologna and other processed lunch meats. Salami. Fatback. Hotdogs. Bratwurst. Salted nuts and seeds. Canned beans with added salt. Canned or smoked fish. Whole eggs or egg yolks. Chicken or [place]  with skin. Dairy Whole or 2% milk, cream, and half-and-half. Whole or full-fat cream cheese. Whole-fat or sweetened yogurt. Full-fat cheese. Nondairy creamers. Whipped toppings. Processed cheese and cheese spreads. Fats and oils Butter. Stick margarine. Lard. Shortening. Ghee. Bacon fat. Tropical oils, such as coconut, palm kernel, or palm oil. Seasoning and other foods Salted popcorn and pretzels. Onion salt, garlic salt, seasoned salt, table salt, and sea salt. Worcestershire sauce. Tartar sauce. Barbecue sauce. Teriyaki sauce. Soy sauce, including reduced-sodium. Steak sauce. Canned and packaged gravies. Fish sauce. Oyster sauce. Cocktail sauce. Horseradish that you find on the shelf. Ketchup. Mustard. Meat flavorings and tenderizers. Bouillon cubes. Hot sauce and Tabasco sauce. Premade or packaged marinades. Premade or packaged taco seasonings. Relishes. Regular salad dressings. Where to find more information:  National Heart, Lung, and [place]: https://wilson-eaton.com/  American Heart Association: www.heart.org Summary  The DASH eating plan is a healthy eating plan that has been shown to reduce high blood pressure (hypertension). It may also reduce your risk for type 2 diabetes, heart disease, and stroke.  With the DASH eating plan, you should limit salt (sodium) intake to 2,300 mg a day. If you have hypertension, you may need to reduce your sodium intake to 1,500 mg a day.  When on the DASH eating plan, aim to eat more  fresh fruits and vegetables, whole grains, lean proteins, low-fat dairy, and heart-healthy fats.  Work with your health care provider or diet and nutrition specialist (dietitian) to adjust your eating plan to your individual calorie needs. This information is not intended to replace advice given to you by your health care provider. Make sure you discuss any questions you have with your health care provider. Document Revised: 11/15/2017 Document Reviewed:  11/26/2016 Elsevier Patient Education  2020 [employer].

## 2020-07-20 NOTE — Progress Notes (Signed)
Patient presenting with left leg pain, left knee swelling, and left foot swelling. Pain is rated 8/10 and when moving the leg a certain way, pain radiates throughout the entire body.   Patient states she had what she thought was a groin pull from exercising and using an elliptical machine. States that it was getting better and she went for a walk this past Sunday. Since then she has been unable to bear weight on the left leg.

## 2020-07-20 NOTE — Progress Notes (Signed)
Chief Complaint  Patient presents with  . Leg Problem   F/u with husband 1. Left groin pull >2 weeks ago with exercise and now compensating with left knee and left ankle pain and left leg turned outward unable to turn in. Worse with walking Sunday with exercise. Husband c/w DVT denies hip pain but having left lower leg pain 8/10 today at times 10-12/10 tried otc aspercream heat,ice and tylenol  2. HTN uncontrolled on losartan 100-12.5 hct  3. DM 2 A1C 6.5 denies medication  Review of Systems  Constitutional: Negative for weight loss.  HENT: Negative for hearing loss.   Eyes: Negative for blurred vision.  Cardiovascular: Negative for chest pain.  Gastrointestinal: Negative for abdominal pain.  Musculoskeletal: Positive for joint pain. Negative for back pain and falls.  Skin: Negative for rash.  Neurological: Negative for headaches.  Psychiatric/Behavioral: Negative for depression.   Past Medical History:  Diagnosis Date  . Diabetes mellitus without complication ([place])   . Essential familial hypercholesterolemia   . History of blood transfusion   . History of hypertension   . Hypertension   . Menopause   . Osteopenia   . Osteoporosis    Past Surgical History:  Procedure Laterality Date  . ABDOMINAL HYSTERECTOMY  1999  . BREAST EXCISIONAL BIOPSY  1966  . CATARACT EXTRACTION W/PHACO Right 01/13/2019   Procedure: CATARACT EXTRACTION PHACO AND INTRAOCULAR LENS PLACEMENT (IOC) RIGHT, vitrectomy;  Surgeon: Marchia Meiers, MD;  Location: [facility] ORS;  Service: Ophthalmology;  Laterality: Right;  [place]    CDE Fluid pack lot # [id number]  . COLONOSCOPY WITH PROPOFOL N/A 05/23/2018   Procedure: COLONOSCOPY WITH PROPOFOL;  Surgeon: Lollie Sails, MD;  Location: [facility] ENDOSCOPY;  Service: Endoscopy;  Laterality: N/A;  . TONSILLECTOMY  1965  . TUBAL LIGATION     Family History  Problem Relation Age of Onset  . Arthritis Mother   . Heart disease Mother   . Stroke Mother   . Hypertension  Mother   . Kidney disease Mother   . Diabetes Mother   . Breast cancer Sister 69  . Diabetes Sister   . Diabetes Sister   . Diabetes Sister   . Diabetes Sister    Social History   Socioeconomic History  . Marital status: Married    Spouse name: Not on file  . Number of children: Not on file  . Years of education: Not on file  . Highest education level: Not on file  Occupational History  . Not on file  Tobacco Use  . Smoking status: Former Smoker    Packs/day: 1.00    Types: Cigarettes    Quit date: 04/27/2006    Years since quitting: 14.2  . Smokeless tobacco: Never Used  . Tobacco comment: former smoker age [age] took break 20 year 1/2 ppd max no FH lung cancer   Vaping Use  . Vaping Use: Never used  Substance and Sexual Activity  . Alcohol use: Not Currently    Alcohol/week: 0.0 standard drinks    Comment: occ  . Drug use: No  . Sexual activity: Not on file  Other Topics Concern  . Not on file  Social History Narrative   Unemployed   2 daughters 1 in [place] another [place]    Social Determinants of Health   Financial Resource Strain:   . Difficulty of Paying Living Expenses:   Food Insecurity:   . Worried About Charity fundraiser in the Last Year:   .  Ran Out of Food in the Last Year:   Transportation Needs:   . Film/video editor (Medical):   Marland Kitchen Lack of Transportation (Non-Medical):   Physical Activity:   . Days of Exercise per Week:   . Minutes of Exercise per Session:   Stress:   . Feeling of Stress :   Social Connections:   . Frequency of Communication with Friends and Family:   . Frequency of Social Gatherings with Friends and Family:   . Attends Religious Services:   . Active Member of Clubs or Organizations:   . Attends Archivist Meetings:   Marland Kitchen Marital Status:   Intimate Partner Violence:   . Fear of Current or Ex-Partner:   . Emotionally Abused:   Marland Kitchen Physically Abused:   . Sexually Abused:    Current Meds    Medication Sig  . Ascorbic Acid (VITAMIN C PO) Take 1 tablet by mouth daily.  . calcium carbonate (OSCAL) 1500 (600 Ca) MG TABS tablet Take 600 mg of elemental calcium by mouth 2 (two) times daily with a meal.  . Cholecalciferol (VITAMIN D-3) 125 MCG (5000 UT) TABS Take 1 tablet by mouth in the morning and at bedtime.   . magnesium oxide (MAG-OX) 400 MG tablet Take 400 mg by mouth daily.  . Omega-3 Fatty Acids (OMEGA 3 PO) Take 1 capsule by mouth 2 (two) times daily.  . [DISCONTINUED] hydrochlorothiazide (MICROZIDE) 12.5 MG capsule Take 1 capsule (12.5 mg total) by mouth daily.  . [DISCONTINUED] losartan (COZAAR) 100 MG tablet Take 1 tablet (100 mg total) by mouth daily.   Allergies  Allergen Reactions  . Penicillin G Shortness Of Breath and Other (See Comments)    Did it involve swelling of the face/tongue/throat, SOB, or low BP? Yes Did it involve sudden or severe rash/hives, skin peeling, or any reaction on the inside of your mouth or nose? Yes Did you need to seek medical attention at a hospital or doctor's office? Yes When did it last happen? About 20-30 years ago If all above answers are "NO", may proceed with cephalosporin use.    No results found for this or any previous visit (from the past 2160 hour(s)). Objective  Body mass index is 27.84 kg/m. Wt Readings from Last 3 Encounters:  07/20/20 152 lb 3.2 oz (69 kg)  04/18/20 159 lb 9.6 oz (72.4 kg)  01/29/20 160 lb (72.6 kg)   Temp Readings from Last 3 Encounters:  07/20/20 98.2 F (36.8 C) (Oral)  01/13/19 (!) 96.7 F (35.9 C) (Temporal)  05/23/18 (!) 97.3 F (36.3 C)   BP Readings from Last 3 Encounters:  07/20/20 (!) 150/96  04/18/20 (!) 156/90  01/29/20 137/83   Pulse Readings from Last 3 Encounters:  07/20/20 (!) 107  01/13/19 (!) 59  05/23/18 72    Physical Exam Vitals and nursing note reviewed.  Constitutional:      Appearance: Normal appearance. She is well-developed, well-groomed and overweight.   HENT:     Head: Normocephalic and atraumatic.  Eyes:     Conjunctiva/sclera: Conjunctivae normal.     Pupils: Pupils are equal, round, and reactive to light.  Cardiovascular:     Rate and Rhythm: Normal rate and regular rhythm.     Heart sounds: Normal heart sounds. No murmur heard.   Pulmonary:     Effort: Pulmonary effort is normal.     Breath sounds: Normal breath sounds.  Musculoskeletal:     Left knee: Swelling present.  Legs:     Comments: Leg everted position  Skin:    General: Skin is warm and dry.  Neurological:     General: No focal deficit present.     Mental Status: She is alert and oriented to person, place, and time. Mental status is at baseline.     Gait: Gait abnormal.     Comments: In wheelchair today  Psychiatric:        Attention and Perception: Attention and perception normal.        Mood and Affect: Mood and affect normal.        Speech: Speech normal.        Behavior: Behavior normal. Behavior is cooperative.        Thought Content: Thought content normal.        Cognition and Memory: Cognition and memory normal.        Judgment: Judgment normal.     Assessment  Plan  Left groin pain ? Hip dislocation, groin strain r/o DVT with left leg pain- Plan: DG Knee Complete 4 Views Left, DG Hip Unilat W OR W/O Pelvis 2-3 Views Left Xray mild arthritis hips  [place] neg DVT  Left knee Xray normal   Essential hypertension - Plan: losartan-hydrochlorothiazide (HYZAAR) 100-12.5 MG tablet, amLODipine (NORVASC) 5 MG tablet added   Hypertension associated with diabetes ([place]) Declines meds  Will need eye exam Tdap, prevnar in future  Foot exam today normal monofilament, clavi b/l feet medially great toe R>L  Overweight (BMI 25.0-29.9)  rec healthy diet and exercise   HM Denies flu shot Tdap rec will disc again at f/u with shingrix, prevnar and pna 23  Declines Hep B/C/HIV  pfizer had 2/2  [facility] referred h/o 03/2016 colonospopy tubular/tubullovillous  adenoma sch Friday colonoscopy  -05/27/18 [facility] colonoscopy tubular adenoma x 2 neg high grade dysplasia f/u in 3 years   mammo [place] 04/12/20 negative    Pap get [facility] appt   04/12/20 dexa osteopenia   [facility] 01/24/2019 aphakia right eye cataract right eye s/p surgery given pred phos, gatifloxacin, bromfenac qid and Durezol bid Dr. Marchia Meiers    Provider: Dr. Olivia Mackie McLean-Scocuzza-Internal Medicine

## 2020-07-21 DIAGNOSIS — E1159 Type 2 diabetes mellitus with other circulatory complications: Secondary | ICD-10-CM | POA: Insufficient documentation

## 2020-07-21 DIAGNOSIS — E663 Overweight: Secondary | ICD-10-CM | POA: Insufficient documentation

## 2020-07-21 DIAGNOSIS — I152 Hypertension secondary to endocrine disorders: Secondary | ICD-10-CM | POA: Insufficient documentation

## 2020-07-28 ENCOUNTER — Telehealth: Payer: Self-pay | Admitting: Internal Medicine

## 2020-07-28 NOTE — Telephone Encounter (Signed)
"  Rejection Reason - Patient did not respond" [facility], [facility] - [place] said about 6 hours ago

## 2020-07-28 NOTE — Telephone Encounter (Signed)
[facility] tried to call patient does she still need appt?

## 2020-07-29 ENCOUNTER — Ambulatory Visit: Payer: MEDICARE | Admitting: Internal Medicine

## 2020-08-03 ENCOUNTER — Telehealth: Payer: Self-pay | Admitting: Internal Medicine

## 2020-08-03 NOTE — Telephone Encounter (Signed)
Left message for patient to call back and schedule Medicare Annual Wellness Visit (AWV)  ° °This should be a telephone visit only=30 minutes. ° °No hx of AWV; please schedule at anytime with Denisa O'Brien-Blaney at [facility] ° ° °

## 2020-08-11 ENCOUNTER — Ambulatory Visit (INDEPENDENT_AMBULATORY_CARE_PROVIDER_SITE_OTHER): Payer: MEDICARE

## 2020-08-11 VITALS — Ht 62.0 in | Wt 152.0 lb

## 2020-08-11 DIAGNOSIS — Z0000 Encounter for general adult medical examination without abnormal findings: Secondary | ICD-10-CM | POA: Diagnosis not present

## 2020-08-11 NOTE — Patient Instructions (Addendum)
  Ms. Morea , Thank you for taking time to come for your Medicare Wellness Visit. I appreciate your ongoing commitment to your health goals. Please review the following plan we discussed and let me know if I can assist you in the future.   These are the goals we discussed: Goals      Patient Stated   .  Weight (lb) < 138 lb (62.6 kg) (pt-stated)      I would like to lose about 15lbs       This is a list of the screening recommended for you and due dates:  Health Maintenance  Topic Date Due  . Flu Shot  11/11/2020*  .  Hepatitis C: One time screening is recommended by Center for Disease Control  (CDC) for  adults born from [birth year] through 1963.   01/28/2021*  . Tetanus Vaccine  08/11/2021*  . Pneumonia vaccines (1 of 2 - PCV13) 08/11/2021*  . Eye exam for diabetics  08/31/2020  . Hemoglobin A1C  09/28/2020  . Complete foot exam   07/20/2021  . Mammogram  04/12/2022  . Colon Cancer Screening  05/23/2028  . DEXA scan (bone density measurement)  Completed  . COVID-19 Vaccine  Completed  *Topic was postponed. The date shown is not the original due date.

## 2020-08-11 NOTE — Progress Notes (Signed)
Subjective:   Christine Cruz is a 69 y.o. female who presents for an Initial Medicare Annual Wellness Visit.  Review of Systems    No ROS.  Medicare Wellness Virtual Visit.    Cardiac Risk Factors include: advanced age (>36men, >63 women);hypertension;diabetes mellitus     Objective:    Today's Vitals   08/11/20 1343  Weight: 152 lb (68.9 kg)  Height: 5\' 2"  (1.575 m)   Body mass index is 27.8 kg/m.  Advanced Directives 08/11/2020 04/18/2020 05/23/2018  Does Patient Have a Medical Advance Directive? Yes Yes No  Type of Paramedic of [place];Living will Living will -  Does patient want to make changes to medical advance directive? No - Patient declined No - Patient declined -  Copy of [place] in Chart? No - copy requested - -  Would patient like information on creating a medical advance directive? - - No - Patient declined    Current Medications (verified) Outpatient Encounter Medications as of 08/11/2020  Medication Sig  . amLODipine (NORVASC) 5 MG tablet Take 1 tablet (5 mg total) by mouth daily.  . Ascorbic Acid (VITAMIN C PO) Take 1 tablet by mouth daily.  . calcium carbonate (OSCAL) 1500 (600 Ca) MG TABS tablet Take 600 mg of elemental calcium by mouth 2 (two) times daily with a meal.  . Cholecalciferol (VITAMIN D-3) 125 MCG (5000 UT) TABS Take 1 tablet by mouth in the morning and at bedtime.   Marland Kitchen losartan-hydrochlorothiazide (HYZAAR) 100-12.5 MG tablet Take 1 tablet by mouth daily.  . magnesium oxide (MAG-OX) 400 MG tablet Take 400 mg by mouth daily.  . Omega-3 Fatty Acids (OMEGA 3 PO) Take 1 capsule by mouth 2 (two) times daily.   No facility-administered encounter medications on file as of 08/11/2020.    Allergies (verified) Penicillin g   History: Past Medical History:  Diagnosis Date  . Diabetes mellitus without complication ([place])   . Essential familial hypercholesterolemia   . History of blood transfusion   . History  of hypertension   . Hypertension   . Menopause   . Osteopenia   . Osteoporosis    Past Surgical History:  Procedure Laterality Date  . ABDOMINAL HYSTERECTOMY  1999  . BREAST EXCISIONAL BIOPSY  1966  . CATARACT EXTRACTION W/PHACO Right 01/13/2019   Procedure: CATARACT EXTRACTION PHACO AND INTRAOCULAR LENS PLACEMENT (IOC) RIGHT, vitrectomy;  Surgeon: Marchia Meiers, MD;  Location: [facility] ORS;  Service: Ophthalmology;  Laterality: Right;  [place]    CDE Fluid pack lot # [id number]  . COLONOSCOPY WITH PROPOFOL N/A 05/23/2018   Procedure: COLONOSCOPY WITH PROPOFOL;  Surgeon: Lollie Sails, MD;  Location[facility] END[facility]COPY;  Service: Endoscopy;  Laterality: N/A;  . TONSILLECTOMY  1965  . TUBAL LIGATION     Family History  Problem Relation Age of Onset  . Arthritis Mother   . Heart disease Mother   . Stroke Mother   . Hypertension Mother   . Kidney disease Mother   . Diabetes Mother   . Breast cancer Sister 35  . Diabetes Sister   . Diabetes Sister   . Diabetes Sister   . Diabetes Sister    Social History   Socioeconomic History  . Marital status: Married    Spouse name: Not on file  . Number of children: Not on file  . Years of education: Not on file  . Highest education level: Not on file  Occupational History  . Not on file  Tobacco Use  . Smoking status: Former Smoker    Packs/day: 1.00    Types: Cigarettes    Quit date: 04/27/2006    Years since quitting: 14.3  . Smokeless tobacco: Never Used  . Tobacco comment: former smoker age [age] took break 60 year 1/2 ppd max no FH lung cancer   Vaping Use  . Vaping Use: Never used  Substance and Sexual Activity  . Alcohol use: Not Currently    Alcohol/week: 0.0 standard drinks    Comment: occ  . Drug use: No  . Sexual activity: Not on file  Other Topics Concern  . Not on file  Social History Narrative   Unemployed   2 daughters 1 in [place] another [place]    Social Determinants of Health   Financial  Resource Strain: Low Risk   . Difficulty of Paying Living Expenses: Not hard at all  Food Insecurity: No Food Insecurity  . Worried About Charity fundraiser in the Last Year: Never true  . Ran Out of Food in the Last Year: Never true  Transportation Needs: No Transportation Needs  . Lack of Transportation (Medical): No  . Lack of Transportation (Non-Medical): No  Physical Activity:   . Days of Exercise per Week: Not on file  . Minutes of Exercise per Session: Not on file  Stress: No Stress Concern Present  . Feeling of Stress : Not at all  Social Connections:   . Frequency of Communication with Friends and Family: Not on file  . Frequency of Social Gatherings with Friends and Family: Not on file  . Attends Religious Services: Not on file  . Active Member of Clubs or Organizations: Not on file  . Attends Archivist Meetings: Not on file  . Marital Status: Not on file    Tobacco Counseling Counseling given: Not Answered Comment: former smoker age [age] took break 72 year 1/2 ppd max no FH lung cancer    Clinical Intake:  Pre-visit preparation completed: Yes        Diabetes: No  How often do you need to have someone help you when you read instructions, pamphlets, or other written materials from your doctor or pharmacy?: 1 - Never   Interpreter Needed?: No      Activities of Daily Living In your present state of health, do you have any difficulty performing the following activities: 08/11/2020  Hearing? Y  Comment Hearing loss L ear  Vision? N  Difficulty concentrating or making decisions? N  Walking or climbing stairs? N  Dressing or bathing? N  Doing errands, shopping? N  Preparing Food and eating ? N  Using the Toilet? N  In the past six months, have you accidently leaked urine? N  Do you have problems with loss of bowel control? N  Managing your Medications? N  Managing your Finances? N  Housekeeping or managing your Housekeeping? N  Some recent  data might be hidden    Patient Care Team: McLean-Scocuzza, Nino Glow, MD as PCP - General (Internal Medicine)  Indicate any recent Medical Services you may have received from other than [facility] providers in the past year (date may be approximate).     Assessment:   This is a routine wellness examination for Christine Cruz.  I connected with Christine Cruz today by telephone and verified that I am speaking with the correct person using two identifiers. Location patient: home Location provider: work Persons participating in the virtual visit: patient, Marine scientist.  I discussed the limitations, risks, security and privacy concerns of performing an evaluation and management service by telephone and the availability of in person appointments. The patient expressed understanding and verbally consented to this telephonic visit.    Interactive audio and video telecommunications were attempted between this provider and patient, however failed, due to patient having technical difficulties OR patient did not have access to video capability.  We continued and completed visit with audio only.  Some vital signs may be absent or patient reported.   Hearing/Vision screen  Hearing Screening   125Hz  250Hz  500Hz  1000Hz  2000Hz  3000Hz  4000Hz  6000Hz  8000Hz   Right ear:           Left ear:           Comments: Left ear hearing loss No hearing aid worn  Vision Screening Comments: Followed by [facility] Wears reader lenses Cataract extraction, L eye Visual acuity not assessed, virtual visit.  They have seen their ophthalmologist .    Notes next eye exam will be 08/2020.   Dietary issues and exercise activities discussed: Current Exercise Habits: Home exercise routine, Type of exercise: walking, Intensity: Mild Low carb balanced diet Good water intake  Goals      Patient Stated   .  Weight (lb) < 138 lb (62.6 kg) (pt-stated)      I would like to lose about 15lbs      Depression Screen PHQ 2/9 Scores  08/11/2020 04/18/2020 01/29/2020 05/21/2018  PHQ - 2 Score 0 0 0 0    Fall Risk Fall Risk  08/11/2020 07/20/2020 04/18/2020 01/29/2020 05/21/2018  Falls in the past year? 0 0 0 0 No  Number falls in past yr: 0 0 - - -  Injury with Fall? - 0 - - -  Follow up Falls evaluation completed Falls evaluation completed - - -   Handrails in use when climbing stairs? Yes  Home free of loose throw rugs in walkways, pet beds, electrical cords, etc? Yes  Adequate lighting in your home to reduce risk of falls? Yes   ASSISTIVE DEVICES UTILIZED TO PREVENT FALLS:  Life alert? No  Use of a cane, walker or w/c? No  Grab bars in the bathroom? No  Shower chair or bench in shower? No  Elevated toilet seat or a handicapped toilet? No   TIMED UP AND GO:  Was the test performed? No . Virtual visit.   Cognitive Function:  Patient is alert and oriented x3.  Denies difficulty making decisions, focusing, memory loss.  Enjoys reading, mediating and doing research for brain health.      Immunizations Immunization History  Administered Date(s) Administered  . Influenza, High Dose Seasonal PF 10/01/2019  . PFIZER SARS-COV-2 Vaccination 01/30/2020, 02/20/2020    TDAP status: Due, Education has been provided regarding the importance of this vaccine. Advised may receive this vaccine at local pharmacy or Health Dept. Aware to provide a copy of the vaccination record if obtained from local pharmacy or Health Dept. Verbalized acceptance and understanding. Deferred.   Pneumococcal vaccine- Advised may receive this vaccine in office, local pharmacy or Health Dept. Aware to provide a copy of the vaccination record if obtained from local pharmacy or Health Dept. Verbalized acceptance and understanding. Deferred.    Health Maintenance Health Maintenance  Topic Date Due  . INFLUENZA VACCINE  11/11/2020 (Originally 07/17/2020)  . Hepatitis C Screening  01/28/2021 (Originally 1950/12/28)  . TETANUS/TDAP  08/11/2021 (Originally  06/04/1970)  . PNA vac Low Risk Adult (  1 of 2 - PCV13) 08/11/2021 (Originally 06/04/2018)  . OPHTHALMOLOGY EXAM  08/31/2020  . HEMOGLOBIN A1C  09/28/2020  . FOOT EXAM  07/20/2021  . MAMMOGRAM  04/12/2022  . COLONOSCOPY  05/23/2028  . DEXA SCAN  Completed  . COVID-19 Vaccine  Completed    Dental Screening: Recommended annual dental exams for proper oral hygiene.  Community Resource Referral / Chronic Care Management: CRR required this visit?  No   CCM required this visit?  No      Plan:    Keep all routine maintenance appointments.   I have personally reviewed and noted the following in the patient's chart:   . Medical and social history . Use of alcohol, tobacco or illicit drugs  . Current medications and supplements . Functional ability and status . Nutritional status . Physical activity . Advanced directives . List of other physicians . Hospitalizations, surgeries, and ER visits in previous 12 months . Vitals . Screenings to include cognitive, depression, and falls . Referrals and appointments  In addition, I have reviewed and discussed with patient certain preventive protocols, quality metrics, and best practice recommendations. A written personalized care plan for preventive services as well as general preventive health recommendations were provided to patient via mychart.     Varney Biles, LPN   02/07/4113

## 2020-09-28 DIAGNOSIS — Z23 Encounter for immunization: Secondary | ICD-10-CM | POA: Diagnosis not present

## 2020-10-17 DIAGNOSIS — H40053 Ocular hypertension, bilateral: Secondary | ICD-10-CM | POA: Diagnosis not present

## 2020-11-19 DIAGNOSIS — Z23 Encounter for immunization: Secondary | ICD-10-CM | POA: Diagnosis not present

## 2020-12-22 IMAGING — MG DIGITAL SCREENING BILAT W/ TOMO W/ CAD
8 series · 8 of 24 positions shown · non-contrast
Comparison: Previous exam(s).

CLINICAL DATA: Screening.

EXAM:
DIGITAL SCREENING BILATERAL MAMMOGRAM WITH TOMO AND CAD

[R CC synth-2D]
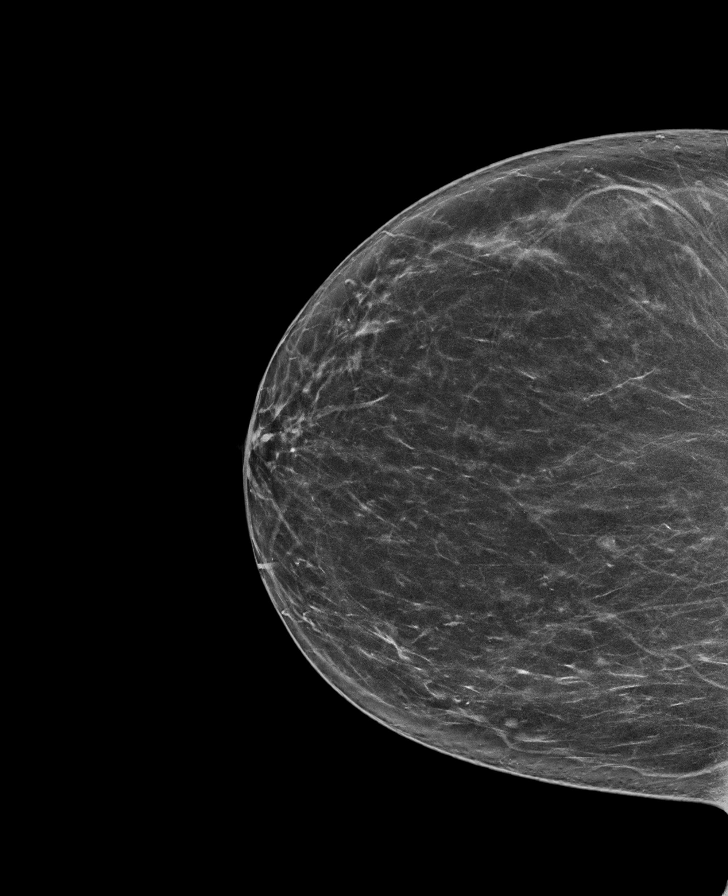

[L MLO synth-2D]
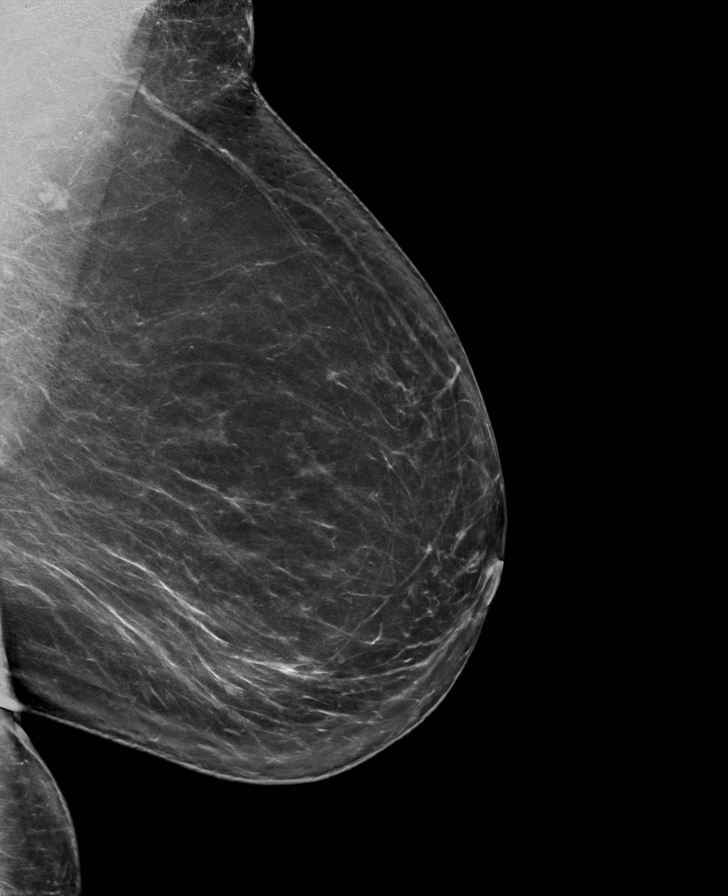

[L CC synth-2D]
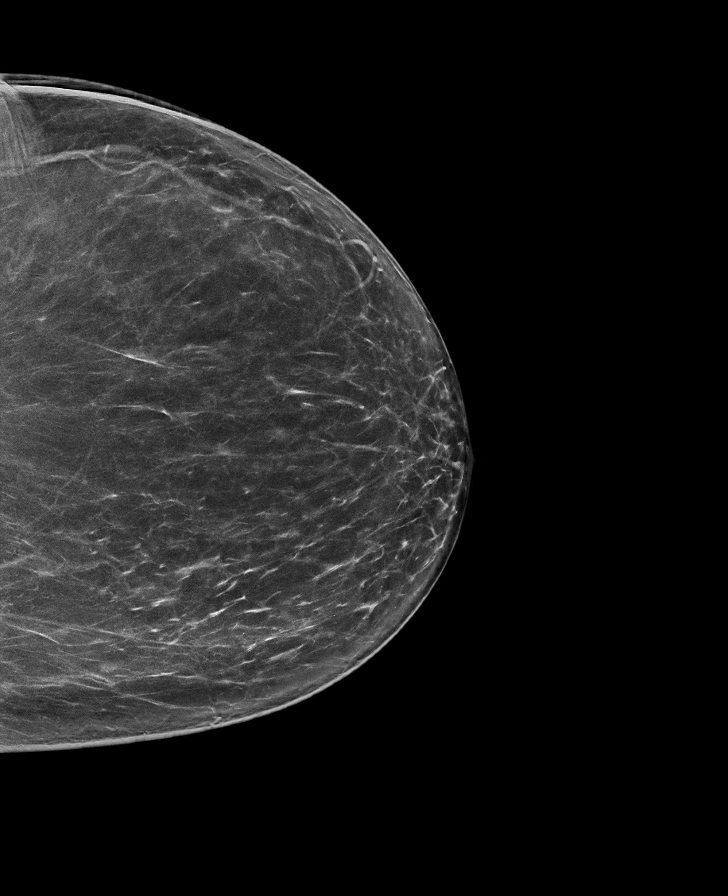

[R MLO synth-2D]
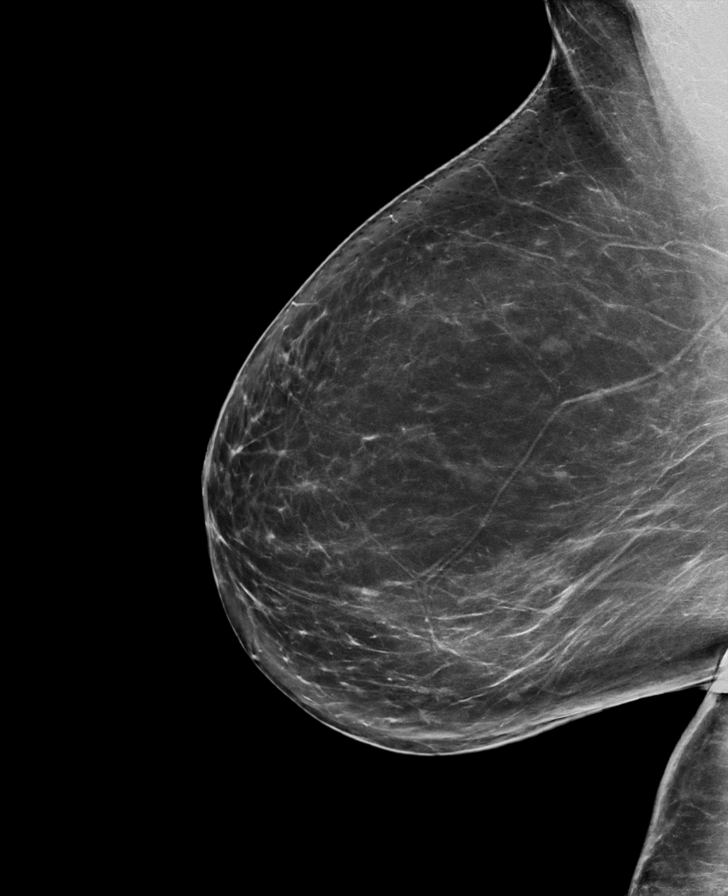

[R CC tomo · tomo slice 37/72.0]
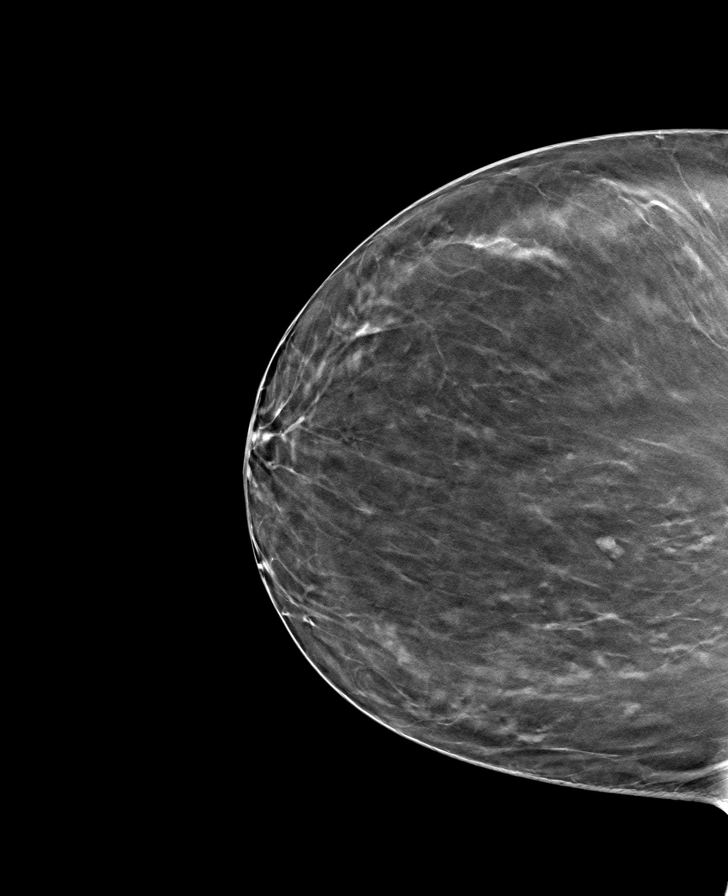

[L MLO tomo · tomo slice 42/83.0]
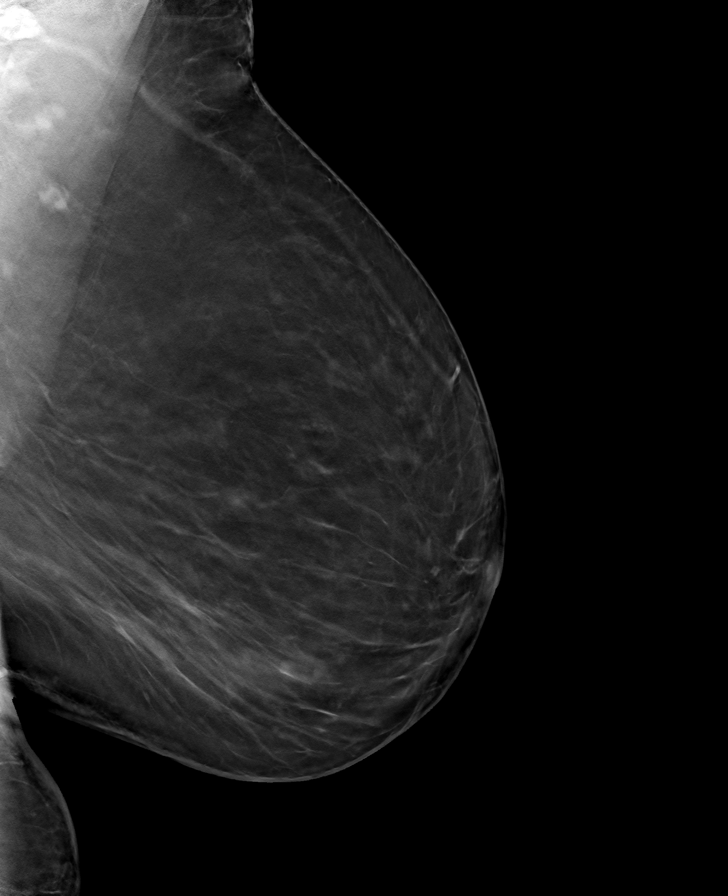

[L CC tomo · tomo slice 39/78.0]
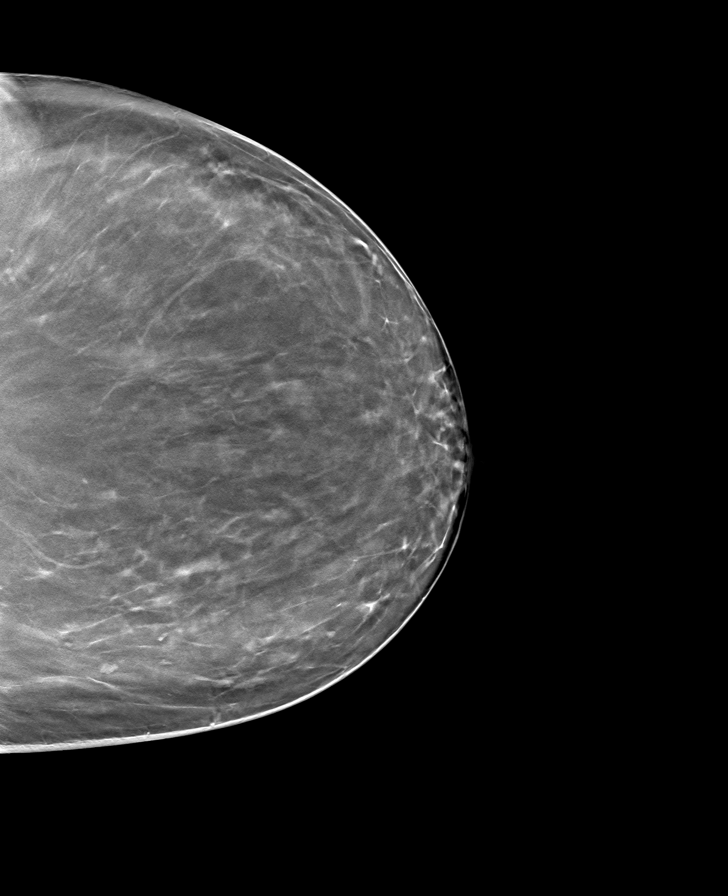

[R MLO tomo · tomo slice 42/83.0]
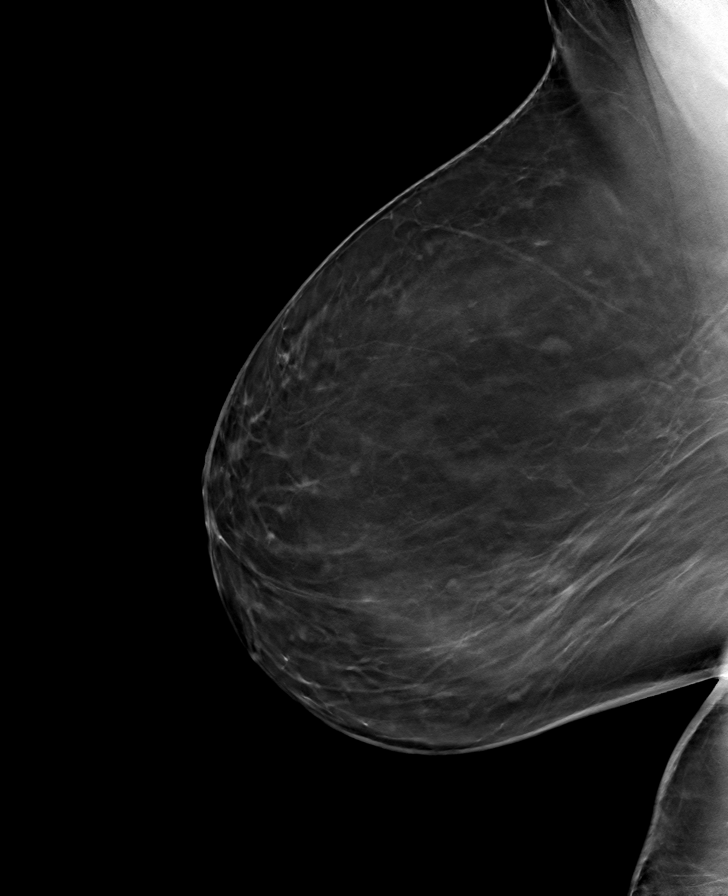

[8 of 24 positions shown; findings below may reference images not displayed]

ACR Breast Density Category b: There are scattered areas of
fibroglandular density.
FINDINGS: There are no findings suspicious for malignancy. Images were
processed with CAD.
IMPRESSION: No mammographic evidence of malignancy. A result letter of this
screening mammogram will be mailed directly to the patient.

RECOMMENDATION:
Screening mammogram in one year. (Code:[id number])

BI-RADS CATEGORY  1: Negative.

## 2021-02-21 DIAGNOSIS — H40053 Ocular hypertension, bilateral: Secondary | ICD-10-CM | POA: Diagnosis not present

## 2021-02-21 DIAGNOSIS — H2512 Age-related nuclear cataract, left eye: Secondary | ICD-10-CM | POA: Diagnosis not present

## 2021-04-10 DIAGNOSIS — H2512 Age-related nuclear cataract, left eye: Secondary | ICD-10-CM | POA: Diagnosis not present

## 2021-04-14 DIAGNOSIS — H2000 Unspecified acute and subacute iridocyclitis: Secondary | ICD-10-CM | POA: Diagnosis not present

## 2021-04-19 ENCOUNTER — Encounter: Payer: Self-pay | Admitting: Ophthalmology

## 2021-04-24 NOTE — Discharge Instructions (Signed)

## 2021-04-26 ENCOUNTER — Other Ambulatory Visit: Payer: Self-pay

## 2021-04-26 ENCOUNTER — Encounter
Admission: RE | Disposition: A | Discharge status: Home / Self Care | Payer: Self-pay | Source: Home / Self Care | Attending: Ophthalmology

## 2021-04-26 ENCOUNTER — Ambulatory Visit: Payer: Medicare Other | Admitting: Anesthesiology

## 2021-04-26 ENCOUNTER — Ambulatory Visit
Admission: RE | Admit: 2021-04-26 | Discharge: 2021-04-26 | Disposition: A | Discharge status: Home / Self Care | Payer: Medicare Other | Attending: Ophthalmology | Admitting: Ophthalmology

## 2021-04-26 ENCOUNTER — Encounter: Payer: Self-pay | Admitting: Ophthalmology

## 2021-04-26 DIAGNOSIS — H2512 Age-related nuclear cataract, left eye: Secondary | ICD-10-CM | POA: Diagnosis not present

## 2021-04-26 DIAGNOSIS — E1136 Type 2 diabetes mellitus with diabetic cataract: Secondary | ICD-10-CM | POA: Insufficient documentation

## 2021-04-26 DIAGNOSIS — H25812 Combined forms of age-related cataract, left eye: Secondary | ICD-10-CM | POA: Diagnosis not present

## 2021-04-26 DIAGNOSIS — Z79899 Other long term (current) drug therapy: Secondary | ICD-10-CM | POA: Insufficient documentation

## 2021-04-26 DIAGNOSIS — Z961 Presence of intraocular lens: Secondary | ICD-10-CM | POA: Diagnosis not present

## 2021-04-26 DIAGNOSIS — Z87891 Personal history of nicotine dependence: Secondary | ICD-10-CM | POA: Insufficient documentation

## 2021-04-26 DIAGNOSIS — Z880 Allergy status to penicillin: Secondary | ICD-10-CM | POA: Insufficient documentation

## 2021-04-26 DIAGNOSIS — Z9841 Cataract extraction status, right eye: Secondary | ICD-10-CM | POA: Insufficient documentation

## 2021-04-26 HISTORY — PX: CATARACT EXTRACTION W/PHACO: SHX586

## 2021-04-26 SURGERY — PHACOEMULSIFICATION, CATARACT, WITH IOL INSERTION
Anesthesia: Monitor Anesthesia Care | Site: Eye | Laterality: Left

## 2021-04-26 MED ORDER — LACTATED RINGERS IV SOLN: INTRAVENOUS | Status: DC

## 2021-04-26 MED ORDER — MIDAZOLAM HCL 2 MG/2ML IJ SOLN
INTRAMUSCULAR | Status: DC | PRN
  Administered 2021-04-26: 2 mg via INTRAVENOUS

## 2021-04-26 MED ORDER — NA HYALUR & NA CHOND-NA HYALUR 0.4-0.35 ML IO KIT
PACK | INTRAOCULAR | Status: DC | PRN
  Administered 2021-04-26: 1 mL via INTRAOCULAR

## 2021-04-26 MED ORDER — MOXIFLOXACIN HCL 0.5 % OP SOLN
OPHTHALMIC | Status: DC | PRN
  Administered 2021-04-26: 0.2 mL via OPHTHALMIC

## 2021-04-26 MED ORDER — TETRACAINE HCL 0.5 % OP SOLN
1.0000 [drp] | OPHTHALMIC | Status: DC | PRN
  Administered 2021-04-26 (×3): 1 [drp] via OPHTHALMIC

## 2021-04-26 MED ORDER — EPINEPHRINE PF 1 MG/ML IJ SOLN
INTRAOCULAR | Status: DC | PRN
  Administered 2021-04-26: 57 mL via OPHTHALMIC

## 2021-04-26 MED ORDER — LIDOCAINE HCL (PF) 2 % IJ SOLN
INTRAOCULAR | Status: DC | PRN
  Administered 2021-04-26: 1 mL via INTRAMUSCULAR

## 2021-04-26 MED ORDER — FENTANYL CITRATE (PF) 100 MCG/2ML IJ SOLN
INTRAMUSCULAR | Status: DC | PRN
  Administered 2021-04-26: 50 ug via INTRAVENOUS

## 2021-04-26 MED ORDER — BRIMONIDINE TARTRATE-TIMOLOL 0.2-0.5 % OP SOLN
OPHTHALMIC | Status: DC | PRN
  Administered 2021-04-26: 1 [drp] via OPHTHALMIC

## 2021-04-26 MED ORDER — ARMC OPHTHALMIC DILATING DROPS
1.0000 "application " | OPHTHALMIC | Status: DC | PRN
  Administered 2021-04-26 (×3): 1 via OPHTHALMIC

## 2021-04-26 SURGICAL SUPPLY — 18 items
CANNULA ANT/CHMB 27GA (MISCELLANEOUS) ×2 IMPLANT
GLOVE SURG ENC TEXT LTX SZ7.5 (GLOVE) ×2 IMPLANT
GLOVE SURG TRIUMPH 8.0 PF LTX (GLOVE) ×2 IMPLANT
GOWN STRL REUS W/ TWL LRG LVL3 (GOWN DISPOSABLE) ×2 IMPLANT
GOWN STRL REUS W/TWL LRG LVL3 (GOWN DISPOSABLE) ×4
LENS IOL TECNIS EYHANCE 17.0 (Intraocular Lens) ×2 IMPLANT
MARKER SKIN DUAL TIP RULER LAB (MISCELLANEOUS) ×2 IMPLANT
NEEDLE CAPSULORHEX 25GA (NEEDLE) ×2 IMPLANT
NEEDLE FILTER BLUNT 18X 1/2SAF (NEEDLE) ×2
NEEDLE FILTER BLUNT 18X1 1/2 (NEEDLE) ×2 IMPLANT
PACK CATARACT BRASINGTON (MISCELLANEOUS) ×2 IMPLANT
PACK EYE AFTER SURG (MISCELLANEOUS) ×2 IMPLANT
PACK OPTHALMIC (MISCELLANEOUS) ×2 IMPLANT
SOLUTION OPHTHALMIC SALT (MISCELLANEOUS) ×2 IMPLANT
SYR 3ML LL SCALE MARK (SYRINGE) ×4 IMPLANT
SYR TB 1ML LUER SLIP (SYRINGE) ×2 IMPLANT
WATER STERILE IRR 250ML POUR (IV SOLUTION) ×2 IMPLANT
WIPE NON LINTING 3.25X3.25 (MISCELLANEOUS) ×2 IMPLANT

## 2021-04-26 NOTE — H&P (Signed)
[facility]   Primary Care Physician:  McLean-Scocuzza, Nino Glow, MD Ophthalmologist: Dr. Leandrew Koyanagi  Pre-Procedure History & Physical: HPI:  Christine Cruz is a 70 y.o. female here for ophthalmic surgery.   Past Medical History:  Diagnosis Date  . Diabetes mellitus without complication ([place])    Diet Controlled  . Essential familial hypercholesterolemia   . History of blood transfusion   . History of hypertension   . Hypertension   . Menopause   . Osteopenia   . Osteoporosis     Past Surgical History:  Procedure Laterality Date  . ABDOMINAL HYSTERECTOMY  1999  . BREAST EXCISIONAL BIOPSY  1966  . CATARACT EXTRACTION W/PHACO Right 01/13/2019   Procedure: CATARACT EXTRACTION PHACO AND INTRAOCULAR LENS PLACEMENT (IOC) RIGHT, vitrectomy;  Surgeon: Marchia Meiers, MD;  Location: [facility] ORS;  Service: Ophthalmology;  Laterality: Right;  [place]    CDE Fluid pack lot # [id number]  . COLONOSCOPY WITH PROPOFOL N/A 05/23/2018   Procedure: COLONOSCOPY WITH PROPOFOL;  Surgeon: Lollie Sails, MD;  Location: [facility] ENDOSCOPY;  Service: Endoscopy;  Laterality: N/A;  . TONSILLECTOMY  1965  . TUBAL LIGATION      Prior to Admission medications   Medication Sig Start Date End Date Taking? Authorizing Provider  amLODipine (NORVASC) 5 MG tablet Take 1 tablet (5 mg total) by mouth daily. 07/20/20  Yes McLean-Scocuzza, Nino Glow, MD  Ascorbic Acid (VITAMIN C PO) Take 1 tablet by mouth daily.   Yes [provider]  calcium carbonate (OSCAL) 1500 (600 Ca) MG TABS tablet Take 600 mg of elemental calcium by mouth 2 (two) times daily with a meal.   Yes [provider]  Cholecalciferol (VITAMIN D-3) 125 MCG (5000 UT) TABS Take 1 tablet by mouth in the morning and at bedtime.    Yes [provider]  losartan-hydrochlorothiazide (HYZAAR) 100-12.5 MG tablet Take 1 tablet by mouth daily. 07/20/20  Yes McLean-Scocuzza, Nino Glow, MD  magnesium oxide (MAG-OX) 400 MG tablet Take 400 mg  by mouth daily.   Yes [provider]  Omega-3 Fatty Acids (OMEGA 3 PO) Take 1 capsule by mouth 2 (two) times daily.   Yes [provider]    Allergies as of 02/23/2021 - Review Complete 08/11/2020  Allergen Reaction Noted  . Penicillin g Shortness Of Breath and Other (See Comments) 04/27/2016    Family History  Problem Relation Age of Onset  . Arthritis Mother   . Heart disease Mother   . Stroke Mother   . Hypertension Mother   . Kidney disease Mother   . Diabetes Mother   . Breast cancer Sister 65  . Diabetes Sister   . Diabetes Sister   . Diabetes Sister   . Diabetes Sister     Social History   Socioeconomic History  . Marital status: Married    Spouse name: Not on file  . Number of children: Not on file  . Years of education: Not on file  . Highest education level: Not on file  Occupational History  . Not on file  Tobacco Use  . Smoking status: Former Smoker    Packs/day: 1.00    Types: Cigarettes    Quit date: 04/27/2006    Years since quitting: 15.0  . Smokeless tobacco: Never Used  . Tobacco comment: former smoker age [age] took break 78 year 1/2 ppd max no FH lung cancer   Vaping Use  . Vaping Use: Never used  Substance and Sexual Activity  .  Alcohol use: Not Currently    Alcohol/week: 0.0 standard drinks    Comment: occ  . Drug use: No  . Sexual activity: Not on file  Other Topics Concern  . Not on file  Social History Narrative   Unemployed   2 daughters 1 in [place] another [place]    Social Determinants of Health   Financial Resource Strain: Low Risk   . Difficulty of Paying Living Expenses: Not hard at all  Food Insecurity: No Food Insecurity  . Worried About Charity fundraiser in the Last Year: Never true  . Ran Out of Food in the Last Year: Never true  Transportation Needs: No Transportation Needs  . Lack of Transportation (Medical): No  . Lack of Transportation (Non-Medical): No  Physical Activity: Not  on file  Stress: No Stress Concern Present  . Feeling of Stress : Not at all  Social Connections: Not on file  Intimate Partner Violence: Not At Risk  . Fear of Current or Ex-Partner: No  . Emotionally Abused: No  . Physically Abused: No  . Sexually Abused: No    Review of Systems: See HPI, otherwise negative ROS  Physical Exam: BP (!) 169/74   Pulse 92   Temp (!) 97 F (36.1 C) (Temporal)   Ht 5\' 1"  (1.549 m)   Wt 70.3 kg   SpO2 98%   BMI 29.29 kg/m  General:   Alert,  pleasant and cooperative in NAD Head:  Normocephalic and atraumatic. Lungs:  Clear to auscultation.    Heart:  Regular rate and rhythm.   Impression/Plan: Christine Cruz is here for ophthalmic surgery.  Risks, benefits, limitations, and alternatives regarding ophthalmic surgery have been reviewed with the patient.  Questions have been answered.  All parties agreeable.   Leandrew Koyanagi, MD  04/26/2021, 7:37 AM

## 2021-04-26 NOTE — Op Note (Signed)
OPERATIVE NOTE  Christine Cruz [id number] 04/26/2021   PREOPERATIVE DIAGNOSIS:  Nuclear sclerotic cataract left eye. H25.12   POSTOPERATIVE DIAGNOSIS:    Nuclear sclerotic cataract left eye.     PROCEDURE:  Phacoemusification with posterior chamber intraocular lens placement of the left eye  Ultrasound time: Procedure(s) with comments: CATARACT EXTRACTION PHACO AND INTRAOCULAR LENS PLACEMENT (IOC) LEFT DIABETIC (Left) - CDE 10.74 1:14.6 minutes 14.4%  LENS:   Implant Name Type Inv. Item Serial No. Manufacturer Lot No. LRB No. Used Action  LENS IOL TECNIS EYHANCE 17.0 - [id number] Intraocular Lens LENS IOL TECNIS EYHANCE 17.0  JOHNSON   Left 1 Implanted      SURGEON:  Wyonia Hough, MD   ANESTHESIA:  Topical with tetracaine drops and 2% Xylocaine jelly, augmented with 1% preservative-free intracameral lidocaine.    COMPLICATIONS:  None.   DESCRIPTION OF PROCEDURE:  The patient was identified in the holding room and transported to the operating room and placed in the supine position under the operating microscope.  The left eye was identified as the operative eye and it was prepped and draped in the usual sterile ophthalmic fashion.   A 1 millimeter clear-corneal paracentesis was made at the 1:30 position.  0.5 ml of preservative-free 1% lidocaine was injected into the anterior chamber.  The anterior chamber was filled with Viscoat viscoelastic.  A 2.4 millimeter keratome was used to make a near-clear corneal incision at the 10:30 position.  .  A curvilinear capsulorrhexis was made with a cystotome and capsulorrhexis forceps.  Balanced salt solution was used to hydrodissect and hydrodelineate the nucleus.   Phacoemulsification was then used in stop and chop fashion to remove the lens nucleus and epinucleus.  The remaining cortex was then removed using the irrigation and aspiration handpiece. Provisc was then placed into the capsular bag to distend it for lens placement.  A lens  was then injected into the capsular bag.  The remaining viscoelastic was aspirated.   Wounds were hydrated with balanced salt solution.  The anterior chamber was inflated to a physiologic pressure with balanced salt solution.  No wound leaks were noted. Vigamox 0.2 ml of a 1mg  per ml solution was injected into the anterior chamber for a dose of 0.2 mg of intracameral antibiotic at the completion of the case.   Timolol and Brimonidine drops were applied to the eye.  The patient was taken to the recovery room in stable condition without complications of anesthesia or surgery.  [name] 04/26/2021, 8:03 AM

## 2021-04-26 NOTE — Transfer of Care (Signed)
Immediate Anesthesia Transfer of Care Note  Patient: Christine Cruz  Procedure(s) Performed: CATARACT EXTRACTION PHACO AND INTRAOCULAR LENS PLACEMENT (IOC) LEFT DIABETIC (Left Eye)  Patient Location: PACU  Anesthesia Type: MAC  Level of Consciousness: awake, alert  and patient cooperative  Airway and Oxygen Therapy: Patient Spontanous Breathing and Patient connected to supplemental oxygen  Post-op Assessment: Post-op Vital signs reviewed, Patient's Cardiovascular Status Stable, Respiratory Function Stable, Patent Airway and No signs of Nausea or vomiting  Post-op Vital Signs: Reviewed and stable  Complications: No complications documented.

## 2021-04-26 NOTE — Anesthesia Preprocedure Evaluation (Signed)
Anesthesia Evaluation  Patient identified by MRN, date of birth, ID band Patient awake    Reviewed: NPO status   History of Anesthesia Complications Negative for: history of anesthetic complications  Airway Mallampati: II  TM Distance: >3 FB Neck ROM: full    Dental  (+) Missing,    Pulmonary neg pulmonary ROS, former smoker,    Pulmonary exam normal        Cardiovascular Exercise Tolerance: Good hypertension, Normal cardiovascular exam     Neuro/Psych negative neurological ROS  negative psych ROS   GI/Hepatic negative GI ROS, Neg liver ROS,   Endo/Other  diabetes (diet)  Renal/GU negative Renal ROS  negative genitourinary   Musculoskeletal   Abdominal   Peds  Hematology negative hematology ROS (+)   Anesthesia Other Findings   Reproductive/Obstetrics                             Anesthesia Physical Anesthesia Plan  ASA: II  Anesthesia Plan: MAC   Post-op Pain Management:    Induction:   PONV Risk Score and Plan: 2 and Midazolam and TIVA  Airway Management Planned:   Additional Equipment:   Intra-op Plan:   Post-operative Plan:   Informed Consent: I have reviewed the patients History and Physical, chart, labs and discussed the procedure including the risks, benefits and alternatives for the proposed anesthesia with the patient or authorized representative who has indicated his/her understanding and acceptance.       Plan Discussed with: CRNA  Anesthesia Plan Comments:         Anesthesia Quick Evaluation

## 2021-04-26 NOTE — Anesthesia Procedure Notes (Signed)
Procedure Name: MAC Performed by: Marino Rogerson, CRNA Pre-anesthesia Checklist: Patient identified, Emergency Drugs available, Suction available, Timeout performed and Patient being monitored Patient Re-evaluated:Patient Re-evaluated prior to induction Oxygen Delivery Method: Nasal cannula Placement Confirmation: positive ETCO2       

## 2021-04-26 NOTE — Anesthesia Postprocedure Evaluation (Signed)
Anesthesia Post Note  Patient: Christine Cruz  Procedure(s) Performed: CATARACT EXTRACTION PHACO AND INTRAOCULAR LENS PLACEMENT (IOC) LEFT DIABETIC (Left Eye)     Patient location during evaluation: PACU Anesthesia Type: MAC Level of consciousness: awake and alert Pain management: pain level controlled Vital Signs Assessment: post-procedure vital signs reviewed and stable Respiratory status: spontaneous breathing, nonlabored ventilation, respiratory function stable and patient connected to nasal cannula oxygen Cardiovascular status: stable and blood pressure returned to baseline Postop Assessment: no apparent nausea or vomiting Anesthetic complications: no   No complications documented.  Fidel Levy

## 2021-04-27 ENCOUNTER — Encounter: Payer: Self-pay | Admitting: Ophthalmology

## 2021-07-05 ENCOUNTER — Other Ambulatory Visit: Payer: Self-pay | Admitting: Internal Medicine

## 2021-07-05 DIAGNOSIS — I10 Essential (primary) hypertension: Secondary | ICD-10-CM

## 2021-07-17 ENCOUNTER — Other Ambulatory Visit: Payer: Self-pay | Admitting: Internal Medicine

## 2021-07-17 DIAGNOSIS — I10 Essential (primary) hypertension: Secondary | ICD-10-CM

## 2021-07-18 ENCOUNTER — Telehealth: Payer: Self-pay | Admitting: Internal Medicine

## 2021-07-18 ENCOUNTER — Other Ambulatory Visit: Payer: Self-pay | Admitting: Internal Medicine

## 2021-07-18 DIAGNOSIS — I10 Essential (primary) hypertension: Secondary | ICD-10-CM

## 2021-07-18 MED ORDER — LOSARTAN POTASSIUM-HCTZ 100-12.5 MG PO TABS: 1.0000 | ORAL_TABLET | Freq: Every day | ORAL | 0 refills | Status: DC

## 2021-07-18 MED ORDER — AMLODIPINE BESYLATE 5 MG PO TABS: 5.0000 mg | ORAL_TABLET | Freq: Every day | ORAL | 0 refills | Status: DC

## 2021-07-18 NOTE — Telephone Encounter (Signed)
Patient called in and needs refills on her losartan-hydrochlorothiazide (HYZAAR) 100-12.5 MG tablet and amLODipine (NORVASC) 5 MG tablet.

## 2021-08-14 ENCOUNTER — Ambulatory Visit (INDEPENDENT_AMBULATORY_CARE_PROVIDER_SITE_OTHER): Payer: MEDICARE

## 2021-08-14 VITALS — Ht 61.0 in | Wt 155.0 lb

## 2021-08-14 DIAGNOSIS — Z0000 Encounter for general adult medical examination without abnormal findings: Secondary | ICD-10-CM | POA: Diagnosis not present

## 2021-08-14 NOTE — Patient Instructions (Addendum)
  Ms. Karst , Thank you for taking time to come for your Medicare Wellness Visit. I appreciate your ongoing commitment to your health goals. Please review the following plan we discussed and let me know if I can assist you in the future.   These are the goals we discussed:  Goals       Patient Stated     Weight (lb) < 138 lb (62.6 kg) (pt-stated)      I would like to lose about 15 pounds        This is a list of the screening recommended for you and due dates:  Health Maintenance  Topic Date Due   COVID-19 Vaccine (4 - Booster for Pfizer series) 08/30/2021*   Complete foot exam   11/16/2021*   Hemoglobin A1C  11/16/2021*   Flu Shot  03/16/2022*   Mammogram  04/12/2022   Eye exam for diabetics  05/31/2022   Colon Cancer Screening  05/23/2028   DEXA scan (bone density measurement)  Completed   HPV Vaccine  Aged Out   Tetanus Vaccine  Discontinued   Hepatitis C Screening: USPSTF Recommendation to screen - Ages 17-79 yo.  Discontinued   Pneumonia vaccines  Discontinued   Zoster (Shingles) Vaccine  Discontinued  *Topic was postponed. The date shown is not the original due date.    Osteoporosis exercises mailed per patient request.

## 2021-08-14 NOTE — Progress Notes (Signed)
Subjective:   MARCILLA STABER is a 70 y.o. female who presents for Medicare Annual (Subsequent) preventive examination.  Review of Systems    No ROS.  Medicare Wellness Virtual Visit.  Visual/audio telehealth visit, UTA vital signs.   See social history for additional risk factors.   Cardiac Risk Factors include: advanced age (>42mn, >>87women);diabetes mellitus;hypertension     Objective:    Today's Vitals   08/14/21 1321  Weight: 155 lb (70.3 kg)  Height: '5\' 1"'$  (1.549 m)   Body mass index is 29.29 kg/m.  Advanced Directives 08/14/2021 04/26/2021 08/11/2020 04/18/2020 05/23/2018  Does Patient Have a Medical Advance Directive? Yes No Yes Yes No  Type of Advance Directive - - [place] will Living will -  Does patient want to make changes to medical advance directive? No - Patient declined - No - Patient declined No - Patient declined -  Copy of HMuirin Chart? - - No - copy requested - -  Would patient like information on creating a medical advance directive? - No - Patient declined - - No - Patient declined    Current Medications (verified) Outpatient Encounter Medications as of 08/14/2021  Medication Sig   amLODipine (NORVASC) 5 MG tablet Take 1 tablet (5 mg total) by mouth daily. Appt further refills   Ascorbic Acid (VITAMIN C PO) Take 1 tablet by mouth daily.   calcium carbonate (OSCAL) 1500 (600 Ca) MG TABS tablet Take 600 mg of elemental calcium by mouth 2 (two) times daily with a meal.   Cholecalciferol (VITAMIN D-3) 125 MCG (5000 UT) TABS Take 1 tablet by mouth in the morning and at bedtime.    losartan-hydrochlorothiazide (HYZAAR) 100-12.5 MG tablet Take 1 tablet by mouth daily. Appt further refills   magnesium oxide (MAG-OX) 400 MG tablet Take 400 mg by mouth daily.   Omega-3 Fatty Acids (OMEGA 3 PO) Take 1 capsule by mouth 2 (two) times daily.   No facility-administered encounter medications on file as of 08/14/2021.     Allergies (verified) Penicillin g   History: Past Medical History:  Diagnosis Date   Diabetes mellitus without complication ([place]    Diet Controlled   Essential familial hypercholesterolemia    History of blood transfusion    History of hypertension    Hypertension    Menopause    Osteopenia    Osteoporosis    Past Surgical History:  Procedure Laterality Date   ABDOMINAL HYSTERECTOMY  1999   BREAST EXCISIONAL BIOPSY  1966   CATARACT EXTRACTION W/PHACO Right 01/13/2019   Procedure: CATARACT EXTRACTION PHACO AND INTRAOCULAR LENS PLACEMENT (IOC) RIGHT, vitrectomy;  Surgeon: HMarchia Meiers MD;  Location: [facility] ORS;  Service: Ophthalmology;  Laterality: Right;  [place]   CDE Fluid pack lot # [id number]   CATARACT EXTRACTION W/PHACO Left 04/26/2021   Procedure: CATARACT EXTRACTION PHACO AND [place]TRAOCULAR [place]ENS PLACEMENT [place];  Surgeon: BLeandrew Koyanagi MD;  [place]  Service: Ophthalmology;  Laterality: Left;  CDE 10.74 1:14.6 minutes 14.4%   COLONOSCOPY WITH PROPOFOL N/A 05/23/2018   Procedure: COLONOSCOPY WITH PROPOFOL;  Surgeon: SLollie Sails MD;  [facility];  Service: Endoscopy;  Laterality: N/A;   TONSILLECTOMY  1965   TUBAL LIGATION     Family History  Problem Relation Age of Onset   Arthritis Mother    Heart disease Mother    Stroke Mother    Hypertension Mother    Kidney disease Mother  Diabetes Mother    Breast cancer Sister 18   Diabetes Sister    Diabetes Sister    Diabetes Sister    Diabetes Sister    Social History   Socioeconomic History   Marital status: Married    Spouse name: Not on file   Number of children: Not on file   Years of education: Not on file   Highest education level: Not on file  Occupational History   Not on file  Tobacco Use   Smoking status: Former    Packs/day: 1.00    Types: Cigarettes    Quit date: 04/27/2006    Years since quitting: 15.3   Smokeless tobacco: Never    Tobacco comments:    former smoker age [age] took break 15 year 1/2 ppd max no FH lung cancer   Vaping Use   Vaping Use: Never used  Substance and Sexual Activity   Alcohol use: Not Currently    Alcohol/week: 0.0 standard drinks    Comment: occ   Drug use: No   Sexual activity: Not on file  Other Topics Concern   Not on file  Social History Narrative   Unemployed   2 daughters 1 in [place] another [place] Resource Strain: Low Risk    Difficulty of Paying Living Expenses: Not hard at all  Food Insecurity: No Food Insecurity   Worried About Charity fundraiser in the Last Year: Never true   Arboriculturist in the Last Year: Never true  Transportation Needs: No Transportation Needs   Lack of Transportation (Medical): No   Lack of Transportation (Non-Medical): No  Physical Activity: Not on file  Stress: No Stress Concern Present   Feeling of Stress : Not at all  Social Connections: Unknown   Frequency of Communication with Friends and Family: Not on file   Frequency of Social Gatherings with Friends and Family: Not on file   Attends Religious Services: Not on Electrical engineer or Organizations: Not on file   Attends Archivist Meetings: Not on file   Marital Status: Married    Tobacco Counseling Counseling given: Not Answered Tobacco comments: former smoker age [age] took break 36 year 1/2 ppd max no FH lung cancer    Clinical Intake:  Pre-visit preparation completed: Yes        Diabetes: No  How often do you need to have someone help you when you read instructions, pamphlets, or other written materials from your doctor or pharmacy?: 1 - Never  Nutrition Risk Assessment: Has the patient had any N/V/D within the last 2 months?  No  Does the patient have any non-healing wounds?  No  Has the patient had any unintentional weight loss or weight gain?  No   Diabetes Management: Does  the patient want to be seen by Chronic Care Management for management of their diabetes?  No  Would the patient like to be referred to a Nutritionist or for Diabetic Management?  No     Interpreter Needed?: No      Activities of Daily Living In your present state of health, do you have any difficulty performing the following activities: 08/14/2021 04/26/2021  Hearing? N N  Vision? N N  Difficulty concentrating or making decisions? N N  Walking or climbing stairs? N N  Dressing or bathing? N N  Doing errands, shopping? N -  Preparing Food and eating ? N -  Using the Toilet? N -  In the past six months, have you accidently leaked urine? N -  Do you have problems with loss of bowel control? N -  Managing your Medications? N -  Managing your Finances? N -  Housekeeping or managing your Housekeeping? N -  Some recent data might be hidden    Patient Care Team: McLean-Scocuzza, Nino Glow, MD as PCP - General (Internal Medicine)  Indicate any recent Medical Services you may have received from other than [facility] providers in the past year (date may be approximate).     Assessment:   This is a routine wellness examination for Leahrose.  Hearing/Vision screen Hearing Screening - Comments::  Left ear hearing loss No hearing aid worn Vision Screening - Comments:: Followed [facility] Wears reader lenses  Cataract extraction They have seen their ophthalmologist .   Dietary issues and exercise activities discussed: Current Exercise Habits: Home exercise routine, Intensity: Mild   Goals Addressed               This Visit's Progress     Patient Stated     Weight (lb) < 138 lb (62.6 kg) (pt-stated)   155 lb (70.3 kg)     I would like to lose about 15 pounds       Depression Screen PHQ 2/9 Scores 08/14/2021 08/11/2020 04/18/2020 01/29/2020 05/21/2018  PHQ - 2 Score 0 0 0 0 0    Fall Risk Fall Risk  08/14/2021 08/11/2020 07/20/2020 04/18/2020 01/29/2020  Falls in the past year? 0  0 0 0 0  Number falls in past yr: 0 0 0 - -  Injury with Fall? - - 0 - -  Follow up Falls evaluation completed Falls evaluation completed Falls evaluation completed - -    FALL RISK PREVENTION PERTAINING TO THE HOME: Adequate lighting in your home to reduce risk of falls? Yes   ASSISTIVE DEVICES UTILIZED TO PREVENT FALLS: Use of a cane, walker or w/c? No   TIMED UP AND GO: Was the test performed? No .   Cognitive Function:  Patient is alert and oriented x3.   Immunizations Immunization History  Administered Date(s) Administered   Influenza, High Dose Seasonal PF 10/01/2019   PFIZER(Purple Top)SARS-COV-2 Vaccination 01/30/2020, 02/20/2020   HM vaccines discontinued per patient.  A1C-monitored by pcp. Deferred.   Health Maintenance Health Maintenance  Topic Date Due   COVID-19 Vaccine (4 - Booster for Pfizer series) 08/30/2021 (Originally 01/29/2021)   FOOT EXAM  11/16/2021 (Originally 07/20/2021)   HEMOGLOBIN A1C  11/16/2021 (Originally 09/28/2020)   INFLUENZA VACCINE  03/16/2022 (Originally 07/17/2021)   MAMMOGRAM  04/12/2022   OPHTHALMOLOGY EXAM  05/31/2022   COLONOSCOPY (Pts 45-74yr Insurance coverage will need to be confirmed)  05/23/2028   DEXA SCAN  Completed   HPV VACCINES  Aged Out   TETANUS/TDAP  Discontinued   Hepatitis C Screening  Discontinued   PNA vac Low Risk Adult  Discontinued   Zoster Vaccines- Shingrix  Discontinued   Foot exam- denies changes in feet, wounds, numbness, tingling. Followed by pcp.   Hepatitis C Screening: declined.   Vision Screening: Recommended annual ophthalmology exams for early detection of glaucoma and other disorders of the eye.  Dental Screening: Recommended annual dental exams for proper oral hygiene  Community Resource Referral / Chronic Care Management: CRR required this visit?  No   CCM required this visit?  No  Plan:   Keep all routine maintenance appointments.   I have personally reviewed and noted the  following in the patient's chart:   Medical and social history Use of alcohol, tobacco or illicit drugs  Current medications and supplements including opioid prescriptions. Not taking opioid.  Functional ability and status Nutritional status Physical activity Advanced directives List of other physicians Hospitalizations, surgeries, and ER visits in previous 12 months Vitals Screenings to include cognitive, depression, and falls Referrals and appointments  In addition, I have reviewed and discussed with patient certain preventive protocols, quality metrics, and best practice recommendations. A written personalized care plan for preventive services as well as general preventive health recommendations were provided to patient via mychart.     Varney Biles, LPN   [phone]

## 2021-10-13 ENCOUNTER — Other Ambulatory Visit: Payer: Self-pay

## 2021-10-13 ENCOUNTER — Encounter: Payer: Self-pay | Admitting: Internal Medicine

## 2021-10-13 ENCOUNTER — Ambulatory Visit (INDEPENDENT_AMBULATORY_CARE_PROVIDER_SITE_OTHER): Payer: MEDICARE | Admitting: Internal Medicine

## 2021-10-13 ENCOUNTER — Other Ambulatory Visit: Payer: Self-pay | Admitting: Internal Medicine

## 2021-10-13 VITALS — BP 140/90 | HR 92 | Temp 98.1°F | Ht 61.0 in | Wt 159.6 lb

## 2021-10-13 DIAGNOSIS — Z1389 Encounter for screening for other disorder: Secondary | ICD-10-CM

## 2021-10-13 DIAGNOSIS — Z23 Encounter for immunization: Secondary | ICD-10-CM | POA: Diagnosis not present

## 2021-10-13 DIAGNOSIS — Z1231 Encounter for screening mammogram for malignant neoplasm of breast: Secondary | ICD-10-CM

## 2021-10-13 DIAGNOSIS — Z1329 Encounter for screening for other suspected endocrine disorder: Secondary | ICD-10-CM

## 2021-10-13 DIAGNOSIS — E119 Type 2 diabetes mellitus without complications: Secondary | ICD-10-CM

## 2021-10-13 DIAGNOSIS — I10 Essential (primary) hypertension: Secondary | ICD-10-CM

## 2021-10-13 MED ORDER — AMLODIPINE BESYLATE 5 MG PO TABS: 5.0000 mg | ORAL_TABLET | Freq: Every day | ORAL | 1 refills | Status: DC

## 2021-10-13 MED ORDER — LOSARTAN POTASSIUM-HCTZ 100-12.5 MG PO TABS: 1.0000 | ORAL_TABLET | Freq: Every day | ORAL | 1 refills | Status: DC

## 2021-10-13 NOTE — Patient Instructions (Addendum)
Pneumococcal Polysaccharide Vaccine (PPSV23): What You Need to Know 1. Why get vaccinated? Pneumococcal polysaccharide vaccine (PPSV23) can prevent pneumococcal disease. Pneumococcal disease refers to any illness caused by pneumococcal bacteria. These bacteria can cause many types of illnesses, including pneumonia, which is an infection of the lungs. Pneumococcal bacteria are one of the most common causes of pneumonia. Besides pneumonia, pneumococcal bacteria can also cause: Ear infections Sinus infections Meningitis (infection of the tissue covering the brain and spinal cord) Bacteremia (bloodstream infection) Anyone can get pneumococcal disease, but children under 47 years of age, people with certain medical conditions, adults 83 years or older, and cigarette smokers are at the highest risk. Most pneumococcal infections are mild. However, some can result in long-term problems, such as brain damage or hearing loss. Meningitis, bacteremia, and pneumonia caused by pneumococcal disease can be fatal. 2. PPSV23 PPSV23 protects against 23 types of bacteria that cause pneumococcal disease. PPSV23 is recommended for: All adults 57 years or older, Anyone 2 years or older with certain medical conditions that can lead to an increased risk for pneumococcal disease. Most people need only one dose of PPSV23. A second dose of PPSV23, and another type of pneumococcal vaccine called PCV13, are recommended for certain high-risk groups. Your health care provider can give you more information. People 65 years or older should get a dose of PPSV23 even if they have already gotten one or more doses of the vaccine before they turned 52. 3. Talk with your health care provider Tell your vaccine provider if the person getting the vaccine: Has had an allergic reaction after a previous dose of PPSV23, or has any severe, life-threatening allergies. In some cases, your health care provider may decide to postpone PPSV23  vaccination to a future visit. People with minor illnesses, such as a cold, may be vaccinated. People who are moderately or severely ill should usually wait until they recover before getting PPSV23. Your health care provider can give you more information. 4. Risks of a vaccine reaction Redness or pain where the shot is given, feeling tired, fever, or muscle aches can happen after PPSV23. People sometimes faint after medical procedures, including vaccination. Tell your provider if you feel dizzy or have vision changes or ringing in the ears. As with any medicine, there is a very remote chance of a vaccine causing a severe allergic reaction, other serious injury, or death. 5. What if there is a serious problem? An allergic reaction could occur after the vaccinated person leaves the clinic. If you see signs of a severe allergic reaction (hives, swelling of the face and throat, difficulty breathing, a fast heartbeat, dizziness, or weakness), call 9-1-1 and get the person to the nearest hospital. For other signs that concern you, call your health care provider. Adverse reactions should be reported to the Vaccine Adverse Event Reporting System (VAERS). Your health care provider will usually file this report, or you can do it yourself. Visit the VAERS website at www.vaers.SamedayNews.es or call 619-817-5396. VAERS is only for reporting reactions, and VAERS staff do not give medical advice. 6. How can I learn more? Ask your health care provider. Call your local or state health department. Contact the Centers for Disease Control and Prevention (CDC): Call (925)453-9361 (1-800-CDC-INFO) or Visit CDC's website at http://hunter.com/ Vaccine Information Statement PPSV23 Vaccine (10/15/2018) This information is not intended to replace advice given to you by your health care provider. Make sure you discuss any questions you have with your health care provider. Document Revised: 08/05/2020 Document  Reviewed:  08/05/2020 Elsevier Patient Education  [place].  Pneumococcal Conjugate Vaccine (Prevnar 13) Suspension for Injection What is this medication? PNEUMOCOCCAL VACCINE (NEU mo KOK al vak SEEN) is a vaccine used to prevent pneumococcus bacterial infections. These bacteria can cause serious infections like pneumonia, meningitis, and blood infections. This vaccine will lower your chance of getting pneumonia. If you do get pneumonia, it can make your symptoms milder and your illness shorter. This vaccine will not treat an infection and will not cause infection. This vaccine is recommended for infants and young children, adults with certain medical conditions, and adults 28 years or older. This medicine may be used for other purposes; ask your health care provider or pharmacist if you have questions. COMMON BRAND NAME(S): Prevnar, Prevnar 13 What should I tell my care team before I take this medication? They need to know if you have any of these conditions: bleeding problems fever immune system problems an unusual or allergic reaction to pneumococcal vaccine, diphtheria toxoid, other vaccines, latex, other medicines, foods, dyes, or preservatives pregnant or trying to get pregnant breast-feeding How should I use this medication? This vaccine is for injection into a muscle. It is given by a health care professional. A copy of Vaccine Information Statements will be given before each vaccination. Read this sheet carefully each time. The sheet may change frequently. Talk to your pediatrician regarding the use of this medicine in children. While this drug may be prescribed for children as young as 21 weeks old for selected conditions, precautions do apply. Overdosage: If you think you have taken too much of this medicine contact a poison control center or emergency room at once. NOTE: This medicine is only for you. Do not share this medicine with others. What if I miss a dose? It is important not  to miss your dose. Call your doctor or health care professional if you are unable to keep an appointment. What may interact with this medication? medicines for cancer chemotherapy medicines that suppress your immune function steroid medicines like prednisone or cortisone This list may not describe all possible interactions. Give your health care provider a list of all the medicines, herbs, non-prescription drugs, or dietary supplements you use. Also tell them if you smoke, drink alcohol, or use illegal drugs. Some items may interact with your medicine. What should I watch for while using this medication? Mild fever and pain should go away in 3 days or less. Report any unusual symptoms to your doctor or health care professional. What side effects may I notice from receiving this medication? Side effects that you should report to your doctor or health care professional as soon as possible: allergic reactions like skin rash, itching or hives, swelling of the face, lips, or tongue breathing problems confused fast or irregular heartbeat fever over 102 degrees F seizures unusual bleeding or bruising unusual muscle weakness Side effects that usually do not require medical attention (report to your doctor or health care professional if they continue or are bothersome): aches and pains diarrhea fever of 102 degrees F or less headache irritable loss of appetite pain, tender at site where injected trouble sleeping This list may not describe all possible side effects. Call your doctor for medical advice about side effects. You may report side effects to FDA at 1-800-FDA-1088. Where should I keep my medication? This does not apply. This vaccine is given in a clinic, pharmacy, doctor's office, or other health care setting and will not be stored at home. NOTE:  This sheet is a summary. It may not cover all possible information. If you have questions about this medicine, talk to your doctor, pharmacist,  or health care provider.  2022 Elsevier/Gold Standard (2014-09-09 10:27:27)

## 2021-10-13 NOTE — Progress Notes (Signed)
Chief Complaint  Patient presents with   Follow-up   F/u  1. Bp elevated on norvasc 5 mg qd declines increase and on hyzaar 100-12.5 mg qd she is eating out more with house [employer] but will monitor BP and wants to address again at f/u  2. Flu shot today   Review of Systems  Constitutional:  Negative for weight loss.  HENT:  Negative for hearing loss.   Eyes:  Negative for blurred vision.  Respiratory:  Negative for shortness of breath.   Cardiovascular:  Negative for chest pain.  Gastrointestinal:  Negative for abdominal pain.  Musculoskeletal:  Negative for falls and joint pain.  Skin:  Negative for rash.  Neurological:  Negative for headaches.  Psychiatric/Behavioral:  Negative for depression.   Past Medical History:  Diagnosis Date   Diabetes mellitus without complication ([place])    Diet Controlled   Essential familial hypercholesterolemia    History of blood transfusion    History of hypertension    Hypertension    Menopause    Osteopenia    Osteoporosis    Past Surgical History:  Procedure Laterality Date   ABDOMINAL HYSTERECTOMY  1999   BREAST EXCISIONAL BIOPSY  1966   CATARACT EXTRACTION W/PHACO Right 01/13/2019   Procedure: CATARACT EXTRACTION PHACO AND INTRAOCULAR LENS PLACEMENT (I[facility]) RIGHT, vitrectomy;  Surgeon: Marchia Meiers, MD;  Location: [facility] ORS;  Service: Ophthalmology;  Laterality: Right;  [place]    CDE Fluid pack lot # [id number]   CATARACT EXTRACTION W/PHACO Left 04/26/2021   Procedure: CATARACT EXTRACTION PHACO AND INTRAOCULAR LENS PLACEMENT ([place]) LEFT DIABETIC;  Surgeon: Leandrew Koyanagi, MD;  Location: [place];  Service: Ophthalmology;  Laterality: Left;  CDE 10.74 1:14.6 minutes 14.4%   COLONOSCOPY WITH PROPOFOL N/A 05/23/2018   Procedure: COLONOSCOPY WITH PROPOFOL;  Surgeon: Lollie Sails, MD;  Location: [facility] ENDOSCOPY;  Service: Endoscopy;  Laterality: N/A;   TONSILLECTOMY  1965   TUBAL LIGATION     Family History  Problem  Relation Age of Onset   Arthritis Mother    Heart disease Mother    Stroke Mother    Hypertension Mother    Kidney disease Mother    Diabetes Mother    Breast cancer Sister 4   Diabetes Sister    Diabetes Sister    Diabetes Sister    Diabetes Sister    Social History   Socioeconomic History   Marital status: Married    Spouse name: Not on file   Number of children: Not on file   Years of education: Not on file   Highest education level: Not on file  Occupational History   Not on file  Tobacco Use   Smoking status: Former    Packs/day: 1.00    Types: Cigarettes    Quit date: 04/27/2006    Years since quitting: 15.4   Smokeless tobacco: Never   Tobacco comments:    former smoker age [age] took break 82 year 1/2 ppd max no FH lung cancer   Vaping Use   Vaping Use: Never used  Substance and Sexual Activity   Alcohol use: Not Currently    Alcohol/week: 0.0 standard drinks    Comment: occ   Drug use: No   Sexual activity: Not on file  Other Topics Concern   Not on file  Social History Narrative   Unemployed   2 daughters 1 in [place] another [place]    9 siblings(8 sisters  FH htn, dm,  1 brother)   Social Determinants of Radio broadcast assistant Strain: Low Risk    Difficulty of Paying Living Expenses: Not hard at all  Food Insecurity: No Food Insecurity   Worried About Charity fundraiser in the Last Year: Never true   Arboriculturist in the Last Year: Never true  Transportation Needs: No Transportation Needs   Lack of Transportation (Medical): No   Lack of Transportation (Non-Medical): No  Physical Activity: Not on file  Stress: No Stress Concern Present   Feeling of Stress : Not at all  Social Connections: Unknown   Frequency of Communication with Friends and Family: Not on file   Frequency of Social Gatherings with Friends and Family: Not on file   Attends Religious Services: Not on file   Active Member of Clubs or Organizations: Not on  file   Attends Archivist Meetings: Not on file   Marital Status: Married  Human resources officer Violence: Not At Risk   Fear of Current or Ex-Partner: No   Emotionally Abused: No   Physically Abused: No   Sexually Abused: No   Current Meds  Medication Sig   Ascorbic Acid (VITAMIN C PO) Take 1 tablet by mouth daily.   calcium carbonate (OSCAL) 1500 (600 Ca) MG TABS tablet Take 600 mg of elemental calcium by mouth 2 (two) times daily with a meal.   Cholecalciferol (VITAMIN D-3) 125 MCG (5000 UT) TABS Take 1 tablet by mouth in the morning and at bedtime.    magnesium oxide (MAG-OX) 400 MG tablet Take 400 mg by mouth daily.   Omega-3 Fatty Acids (OMEGA 3 PO) Take 1 capsule by mouth 2 (two) times daily.   [DISCONTINUED] amLODipine (NORVASC) 5 MG tablet TAKE 1 TABLET (5 MG TOTAL) BY MOUTH DAILY. APPT FURTHER REFILLS   [DISCONTINUED] losartan-hydrochlorothiazide (HYZAAR) 100-12.5 MG tablet TAKE 1 TABLET BY MOUTH DAILY. APPT FURTHER REFILLS   Allergies  Allergen Reactions   Penicillin G Shortness Of Breath and Other (See Comments)    Did it involve swelling of the face/tongue/throat, SOB, or low BP? Yes Did it involve sudden or severe rash/hives, skin peeling, or any reaction on the inside of your mouth or nose? Yes Did you need to seek medical attention at a hospital or doctor's office? Yes When did it last happen? About 20-30 years ago If all above answers are "NO", may proceed with cephalosporin use.    No results found for this or any previous visit (from the past 2160 hour(s)). Objective  Body mass index is 30.16 kg/m. Wt Readings from Last 3 Encounters:  10/13/21 159 lb 9.6 oz (72.4 kg)  08/14/21 155 lb (70.3 kg)  04/26/21 155 lb (70.3 kg)   Temp Readings from Last 3 Encounters:  10/13/21 98.1 F (36.7 C) (Oral)  04/26/21 (!) 97 F (36.1 C) (Temporal)  07/20/20 98.2 F (36.8 C) (Oral)   BP Readings from Last 3 Encounters:  10/13/21 140/90  04/26/21 119/82   07/20/20 (!) 150/96   Pulse Readings from Last 3 Encounters:  10/13/21 92  04/26/21 (!) 59  07/20/20 (!) 107    Physical Exam Vitals and nursing note reviewed.  Constitutional:      Appearance: Normal appearance. She is well-developed and well-groomed.  HENT:     Head: Normocephalic and atraumatic.  Eyes:     Conjunctiva/sclera: Conjunctivae normal.     Pupils: Pupils are equal, round, and reactive to light.  Cardiovascular:     Rate  and Rhythm: Normal rate and regular rhythm.     Heart sounds: Normal heart sounds. No murmur heard. Abdominal:     Tenderness: There is no abdominal tenderness.  Skin:    General: Skin is warm and dry.  Neurological:     General: No focal deficit present.     Mental Status: She is alert and oriented to person, place, and time. Mental status is at baseline.     Gait: Gait normal.  Psychiatric:        Attention and Perception: Attention and perception normal.        Mood and Affect: Mood and affect normal.        Speech: Speech normal.        Behavior: Behavior normal. Behavior is cooperative.        Thought Content: Thought content normal.        Cognition and Memory: Cognition and memory normal.        Judgment: Judgment normal.    Assessment  Plan  Essential hypertension not at goal <130/<80 - Plan: losartan-hydrochlorothiazide (HYZAAR) 100-12.5 MG tablet, amLODipine (NORVASC) 5 MG tablet consider increase 10 mg qd  Comprehensive metabolic panel, Lipid panel, CBC with Differential/Platelet  Diabetes mellitus without complication ([place]) - Plan: Hemoglobin A1c Rec healthy diet and exercise   HM Flu shot given today Tdap rec will disc again at f/u with shingrix, prevnar and pna 23  Declines Hep B/C/HIV  pfizer had 3/3 Disc consider prevnar and pna 23 vaccines   [facility] referred h/o 03/2016 colonospopy tubular/tubullovillous adenoma sch [facility] colonoscopy  -05/27/18 [facility] colonoscopy tubular adenoma x 2 neg high grade dysplasia f/u in 3  years will ask again when due for repeat    mammo [place] 04/12/20 negative ordered     Pap get [facility] appt    04/12/20 dexa osteopenia    [place] [facility] 01/24/2019 aphakia right eye cataract right eye s/p surgery given pred phos, gatifloxacin, bromfenac qid and Durezol bid Dr. Marchia Meiers     Provider: Dr. Olivia Mackie McLean-Scocuzza-Internal Medicine

## 2021-10-25 ENCOUNTER — Other Ambulatory Visit (INDEPENDENT_AMBULATORY_CARE_PROVIDER_SITE_OTHER): Payer: MEDICARE

## 2021-10-25 ENCOUNTER — Other Ambulatory Visit: Payer: Self-pay

## 2021-10-25 DIAGNOSIS — I10 Essential (primary) hypertension: Secondary | ICD-10-CM

## 2021-10-25 DIAGNOSIS — Z1389 Encounter for screening for other disorder: Secondary | ICD-10-CM | POA: Diagnosis not present

## 2021-10-25 DIAGNOSIS — Z1329 Encounter for screening for other suspected endocrine disorder: Secondary | ICD-10-CM | POA: Diagnosis not present

## 2021-10-25 DIAGNOSIS — E119 Type 2 diabetes mellitus without complications: Secondary | ICD-10-CM

## 2021-10-25 LAB — COMPREHENSIVE METABOLIC PANEL
ALT: 14 U/L (ref 0–35)
AST: 14 U/L (ref 0–37)
Albumin: 4.4 g/dL (ref 3.5–5.2)
Alkaline Phosphatase: 58 U/L (ref 39–117)
BUN: 15 mg/dL (ref 6–23)
CO2: 29 mEq/L (ref 19–32)
Calcium: 9.3 mg/dL (ref 8.4–10.5)
Chloride: 103 mEq/L (ref 96–112)
Creatinine, Ser: 0.85 mg/dL (ref 0.40–1.20)
GFR: 70.41 mL/min (ref >60.00)
Glucose, Bld: 152 mg/dL — ABNORMAL HIGH (ref 70–99)
Potassium: 3.5 mEq/L (ref 3.5–5.1)
Sodium: 139 mEq/L (ref 135–145)
Total Bilirubin: 0.8 mg/dL (ref 0.2–1.2)
Total Protein: 6.7 g/dL (ref 6.0–8.3)

## 2021-10-25 LAB — CBC WITH DIFFERENTIAL/PLATELET
Basophils Absolute: 0 10*3/uL (ref 0.0–0.1)
Basophils Relative: 0.6 % (ref 0.0–3.0)
Eosinophils Absolute: 0.1 10*3/uL (ref 0.0–0.7)
Eosinophils Relative: 2.6 % (ref 0.0–5.0)
HCT: 41.2 % (ref 36.0–46.0)
Hemoglobin: 13.8 g/dL (ref 12.0–15.0)
Lymphocytes Relative: 33.7 % (ref 12.0–46.0)
Lymphs Abs: 1.8 10*3/uL (ref 0.7–4.0)
MCHC: 33.6 g/dL (ref 30.0–36.0)
MCV: 91.5 fl (ref 78.0–100.0)
Monocytes Absolute: 0.4 10*3/uL (ref 0.1–1.0)
Monocytes Relative: 8.4 % (ref 3.0–12.0)
Neutro Abs: 2.9 10*3/uL (ref 1.4–7.7)
Neutrophils Relative %: 54.7 % (ref 43.0–77.0)
Platelets: 167 10*3/uL (ref 150.0–400.0)
RBC: 4.5 Mil/uL (ref 3.87–5.11)
RDW: 13.1 % (ref 11.5–15.5)
WBC: 5.2 10*3/uL (ref 4.0–10.5)

## 2021-10-25 LAB — LIPID PANEL
Cholesterol: 269 mg/dL — ABNORMAL HIGH (ref 0–200)
HDL: 70.6 mg/dL (ref >39.00)
LDL Cholesterol: 170 mg/dL — ABNORMAL HIGH (ref 0–99)
NonHDL: 198.57
Total CHOL/HDL Ratio: 4
Triglycerides: 145 mg/dL (ref 0.0–149.0)
VLDL: 29 mg/dL (ref 0.0–40.0)

## 2021-10-25 LAB — HEMOGLOBIN A1C: Hgb A1c MFr Bld: 6.3 % (ref 4.6–6.5)

## 2021-10-25 LAB — TSH: TSH: 1.56 u[IU]/mL (ref 0.35–5.50)

## 2021-10-26 LAB — URINALYSIS, ROUTINE W REFLEX MICROSCOPIC
Bilirubin Urine: NEGATIVE
Glucose, UA: NEGATIVE
Hgb urine dipstick: NEGATIVE
Ketones, ur: NEGATIVE
Leukocytes,Ua: NEGATIVE
Nitrite: NEGATIVE
Protein, ur: NEGATIVE
Specific Gravity, Urine: 1.02 (ref 1.001–1.035)
pH: 5.5 (ref 5.0–8.0)

## 2021-10-30 ENCOUNTER — Other Ambulatory Visit: Payer: Self-pay | Admitting: Internal Medicine

## 2021-10-30 DIAGNOSIS — E785 Hyperlipidemia, unspecified: Secondary | ICD-10-CM

## 2021-10-30 MED ORDER — ATORVASTATIN CALCIUM 10 MG PO TABS: 10.0000 mg | ORAL_TABLET | Freq: Every day | ORAL | 3 refills | Status: DC

## 2021-12-05 DIAGNOSIS — H40053 Ocular hypertension, bilateral: Secondary | ICD-10-CM | POA: Diagnosis not present

## 2021-12-12 DIAGNOSIS — H40053 Ocular hypertension, bilateral: Secondary | ICD-10-CM | POA: Diagnosis not present

## 2021-12-21 DIAGNOSIS — Z23 Encounter for immunization: Secondary | ICD-10-CM | POA: Diagnosis not present

## 2022-01-16 ENCOUNTER — Ambulatory Visit (INDEPENDENT_AMBULATORY_CARE_PROVIDER_SITE_OTHER): Payer: MEDICARE | Admitting: Internal Medicine

## 2022-01-16 ENCOUNTER — Encounter: Payer: Self-pay | Admitting: Internal Medicine

## 2022-01-16 ENCOUNTER — Other Ambulatory Visit: Payer: Self-pay

## 2022-01-16 VITALS — BP 140/88 | HR 90 | Temp 98.0°F | Ht 61.0 in | Wt 163.6 lb

## 2022-01-16 DIAGNOSIS — E876 Hypokalemia: Secondary | ICD-10-CM | POA: Diagnosis not present

## 2022-01-16 DIAGNOSIS — M25562 Pain in left knee: Secondary | ICD-10-CM | POA: Diagnosis not present

## 2022-01-16 DIAGNOSIS — R7303 Prediabetes: Secondary | ICD-10-CM | POA: Diagnosis not present

## 2022-01-16 DIAGNOSIS — I10 Essential (primary) hypertension: Secondary | ICD-10-CM | POA: Diagnosis not present

## 2022-01-16 DIAGNOSIS — M25462 Effusion, left knee: Secondary | ICD-10-CM | POA: Diagnosis not present

## 2022-01-16 MED ORDER — POTASSIUM CHLORIDE ER 10 MEQ PO CPCR: 20.0000 meq | ORAL_CAPSULE | Freq: Every day | ORAL | 3 refills | Status: DC

## 2022-01-16 MED ORDER — NEBIVOLOL HCL 5 MG PO TABS: 5.0000 mg | ORAL_TABLET | Freq: Every day | ORAL | 3 refills | Status: DC

## 2022-01-16 MED ORDER — LOSARTAN POTASSIUM-HCTZ 100-25 MG PO TABS: 1.0000 | ORAL_TABLET | Freq: Every day | ORAL | 3 refills | Status: DC

## 2022-01-16 NOTE — Progress Notes (Addendum)
Chief Complaint  Patient presents with   Follow-up   Knee Pain    Pain in left knee rated 1/10. No falls or known injuries.    F/u  1. Left knee pain and swelling middle and left knee lateral knee line pain 1/10 and trouble going up stairs to bedroom since Xmas nothing tried xray in 2021 negative  2. Htn elevated on norvasc 5 mg qd feels giving leg swelling, hyzaar 100-12.5 mg qd with h/o low K    Review of Systems  Constitutional:  Negative for weight loss.  HENT:  Negative for hearing loss.   Eyes:  Negative for blurred vision.  Respiratory:  Negative for shortness of breath.   Cardiovascular:  Negative for chest pain.  Gastrointestinal:  Negative for abdominal pain and blood in stool.  Genitourinary:  Negative for dysuria.  Musculoskeletal:  Positive for joint pain. Negative for falls.  Skin:  Negative for rash.  Neurological:  Negative for headaches.  Psychiatric/Behavioral:  Negative for depression.   Past Medical History:  Diagnosis Date   Diabetes mellitus without complication ([place])    Diet Controlled   Essential familial hypercholesterolemia    History of blood transfusion    History of hypertension    Hypertension    Menopause    Osteopenia    Osteoporosis    Past Surgical History:  Procedure Laterality Date   ABDOMINAL HYSTERECTOMY  1999   BREAST EXCISIONAL BIOPSY  1966   CATARACT EXTRACTION W/PHACO Right 01/13/2019   Procedure: CATARACT EXTRACTION PHACO AND INTRAOCULAR LENS PLACEMENT (IOC) RIGHT, vitrectomy;  Surgeon: Marchia Meiers, MD;  Location: [facility] ORS;  Service: Ophthalmology;  Laterality: Right;  [place]    CDE Fluid pack lot # [id number]   CATARACT EXTRACTION W/PHACO Left 04/26/2021   Procedure: CATARACT EXTRACTION PHACO AND INTRAOCULAR LENS PLACEMENT ([place]) LEFT DIABETIC;  Surgeon: Leandrew Koyanagi, MD;  Location: [place];  Service: Ophthalmology;  Laterality: Left;  CDE 10.74 1:14.6 minutes 14.4%   COLONOSCOPY WITH PROPOFOL N/A 05/23/2018    Procedure: COLONOSCOPY WITH PROPOFOL;  Surgeon: Lollie Sails, MD;  Location: [facility] ENDOSCOPY;  Service: Endoscopy;  Laterality: N/A;   TONSILLECTOMY  1965   TUBAL LIGATION     Family History  Problem Relation Age of Onset   Arthritis Mother    Heart disease Mother    Stroke Mother    Hypertension Mother    Kidney disease Mother    Diabetes Mother    Breast cancer Sister 17   Diabetes Sister    Diabetes Sister    Diabetes Sister    Diabetes Sister    Social History   Socioeconomic History   Marital status: Married    Spouse name: Not on file   Number of children: Not on file   Years of education: Not on file   Highest education level: Not on file  Occupational History   Not on file  Tobacco Use   Smoking status: Former    Packs/day: 1.00    Types: Cigarettes    Quit date: 04/27/2006    Years since quitting: 15.7   Smokeless tobacco: Never   Tobacco comments:    former smoker age [age] took break 72 year 1/2 ppd max no FH lung cancer   Vaping Use   Vaping Use: Never used  Substance and Sexual Activity   Alcohol use: Not Currently    Alcohol/week: 0.0 standard drinks    Comment: occ   Drug use: No   Sexual activity: Not  on file  Other Topics Concern   Not on file  Social History Narrative   Unemployed   2 daughters 1 in [place] another [place]    9 siblings(8 sisters  FH htn, dm, 1 brother)   Social Determinants of Health   Financial Resource Strain: Low Risk    Difficulty of Paying Living Expenses: Not hard at all  Food Insecurity: No Food Insecurity   Worried About Charity fundraiser in the Last Year: Never true   Arboriculturist in the Last Year: Never true  Transportation Needs: No Transportation Needs   Lack of Transportation (Medical): No   Lack of Transportation (Non-Medical): No  Physical Activity: Not on file  Stress: No Stress Concern Present   Feeling of Stress : Not at all  Social Connections: Unknown   Frequency of  Communication with Friends and Family: Not on file   Frequency of Social Gatherings with Friends and Family: Not on file   Attends Religious Services: Not on file   Active Member of Clubs or Organizations: Not on file   Attends Archivist Meetings: Not on file   Marital Status: Married  Human resources officer Violence: Not At Risk   Fear of Current or Ex-Partner: No   Emotionally Abused: No   Physically Abused: No   Sexually Abused: No   Current Meds  Medication Sig   Ascorbic Acid (VITAMIN C PO) Take 1 tablet by mouth daily.   atorvastatin (LIPITOR) 10 MG tablet Take 1 tablet (10 mg total) by mouth daily. After 6 pm   calcium carbonate (OSCAL) 1500 (600 Ca) MG TABS tablet Take 600 mg of elemental calcium by mouth 2 (two) times daily with a meal.   Cholecalciferol (VITAMIN D-3) 125 MCG (5000 UT) TABS Take 1 tablet by mouth in the morning and at bedtime.    losartan-hydrochlorothiazide (HYZAAR) 100-25 MG tablet Take 1 tablet by mouth daily. In am D/c 100-12.5 mg qd   magnesium oxide (MAG-OX) 400 MG tablet Take 400 mg by mouth daily.   Omega-3 Fatty Acids (OMEGA 3 PO) Take 1 capsule by mouth 2 (two) times daily.   potassium chloride (MICRO-K) 10 MEQ CR capsule Take 2 capsules (20 mEq total) by mouth daily.   [DISCONTINUED] amLODipine (NORVASC) 5 MG tablet Take 1 tablet (5 mg total) by mouth daily. Appt further refills   [DISCONTINUED] losartan-hydrochlorothiazide (HYZAAR) 100-12.5 MG tablet Take 1 tablet by mouth daily. Appt further refills   [DISCONTINUED] nebivolol (BYSTOLIC) 5 MG tablet Take 1 tablet (5 mg total) by mouth daily.   Allergies  Allergen Reactions   Penicillin G Shortness Of Breath and Other (See Comments)    Did it involve swelling of the face/tongue/throat, SOB, or low BP? Yes Did it involve sudden or severe rash/hives, skin peeling, or any reaction on the inside of your mouth or nose? Yes Did you need to seek medical attention at a hospital or doctor's office?  Yes When did it last happen? About 20-30 years ago If all above answers are NO, may proceed with cephalosporin use.    Recent Results (from the past 2160 hour(s))  Urinalysis, Routine w reflex microscopic     Status: None   Collection Time: 10/25/21  9:09 AM  Result Value Ref Range   Color, Urine YELLOW YELLOW   APPearance CLEAR CLEAR   Specific Gravity, Urine 1.020 1.001 - 1.035   pH 5.5 5.0 - 8.0   Glucose, UA NEGATIVE  NEGATIVE   Bilirubin Urine NEGATIVE NEGATIVE   Ketones, ur NEGATIVE NEGATIVE   Hgb urine dipstick NEGATIVE NEGATIVE   Protein, ur NEGATIVE NEGATIVE   Nitrite NEGATIVE NEGATIVE   Leukocytes,Ua NEGATIVE NEGATIVE  TSH     Status: None   Collection Time: 10/25/21  9:09 AM  Result Value Ref Range   TSH 1.56 0.35 - 5.50 uIU/mL  Hemoglobin A1c     Status: None   Collection Time: 10/25/21  9:09 AM  Result Value Ref Range   Hgb A1c MFr Bld 6.3 4.6 - 6.5 %    Comment: Glycemic Control Guidelines for People with Diabetes:Non Diabetic:  <6%Goal of Therapy: <7%Additional Action Suggested:  >8%   CBC with Differential/Platelet     Status: None   Collection Time: 10/25/21  9:09 AM  Result Value Ref Range   WBC 5.2 4.0 - 10.5 K/uL   RBC 4.50 3.87 - 5.11 Mil/uL   Hemoglobin 13.8 12.0 - 15.0 g/dL   HCT 41.2 36.0 - 46.0 %   MCV 91.5 78.0 - 100.0 fl   MCHC 33.6 30.0 - 36.0 g/dL   RDW 13.1 11.5 - 15.5 %   Platelets 167.0 150.0 - 400.0 K/uL   Neutrophils Relative % 54.7 43.0 - 77.0 %   Lymphocytes Relative 33.7 12.0 - 46.0 %   Monocytes Relative 8.4 3.0 - 12.0 %   Eosinophils Relative 2.6 0.0 - 5.0 %   Basophils Relative 0.6 0.0 - 3.0 %   Neutro Abs 2.9 1.4 - 7.7 K/uL   Lymphs Abs 1.8 0.7 - 4.0 K/uL   Monocytes Absolute 0.4 0.1 - 1.0 K/uL   Eosinophils Absolute 0.1 0.0 - 0.7 K/uL   Basophils Absolute 0.0 0.0 - 0.1 K/uL  Lipid panel     Status: Abnormal   Collection Time: 10/25/21  9:09 AM  Result Value Ref Range   Cholesterol 269 (H) 0 - 200 mg/dL    Comment: ATP  III Classification       Desirable:  < 200 mg/dL               Borderline High:  200 - 239 mg/dL          High:  > = 240 mg/dL   Triglycerides 145.0 0.0 - 149.0 mg/dL    Comment: Normal:  <150 mg/dLBorderline High:  150 - 199 mg/dL   HDL 70.60 >39.00 mg/dL   VLDL 29.0 0.0 - 40.0 mg/dL   LDL Cholesterol 170 (H) 0 - 99 mg/dL   Total CHOL/HDL Ratio 4     Comment:                Men          Women1/2 Average Risk     3.4          3.3Average Risk          5.0          4.42X Average Risk          9.6          7.13X Average Risk          15.0          11.0                       NonHDL 198.57     Comment: NOTE:  Non-HDL goal should be 30 mg/dL higher than patient's LDL goal (i.e. LDL goal of < 70 mg/dL, would have  non-HDL goal of < 100 mg/dL)  Comprehensive metabolic panel     Status: Abnormal   Collection Time: 10/25/21  9:09 AM  Result Value Ref Range   Sodium 139 135 - 145 mEq/L   Potassium 3.5 3.5 - 5.1 mEq/L   Chloride 103 96 - 112 mEq/L   CO2 29 19 - 32 mEq/L   Glucose, Bld 152 (H) 70 - 99 mg/dL   BUN 15 6 - 23 mg/dL   Creatinine, Ser 0.85 0.40 - 1.20 mg/dL   Total Bilirubin 0.8 0.2 - 1.2 mg/dL   Alkaline Phosphatase 58 39 - 117 U/L   AST 14 0 - 37 U/L   ALT 14 0 - 35 U/L   Total Protein 6.7 6.0 - 8.3 g/dL   Albumin 4.4 3.5 - 5.2 g/dL   GFR 70.41 >60.00 mL/min    Comment: Calculated using the CKD-EPI Creatinine Equation (2021)   Calcium 9.3 8.4 - 10.5 mg/dL   Objective  Body mass index is 30.91 kg/m. Wt Readings from Last 3 Encounters:  01/16/22 163 lb 9.6 oz (74.2 kg)  10/13/21 159 lb 9.6 oz (72.4 kg)  08/14/21 155 lb (70.3 kg)   Temp Readings from Last 3 Encounters:  01/16/22 98 F (36.7 C) (Oral)  10/13/21 98.1 F (36.7 C) (Oral)  04/26/21 (!) 97 F (36.1 C) (Temporal)   BP Readings from Last 3 Encounters:  01/16/22 140/88  10/13/21 140/90  04/26/21 119/82   Pulse Readings from Last 3 Encounters:  01/16/22 90  10/13/21 92  04/26/21 (!) 59    Physical  Exam Vitals and nursing note reviewed.  Constitutional:      Appearance: Normal appearance. She is well-developed and well-groomed.  HENT:     Head: Normocephalic and atraumatic.  Eyes:     Conjunctiva/sclera: Conjunctivae normal.     Pupils: Pupils are equal, round, and reactive to light.  Cardiovascular:     Rate and Rhythm: Normal rate and regular rhythm.     Heart sounds: Normal heart sounds. No murmur heard. Pulmonary:     Effort: Pulmonary effort is normal.     Breath sounds: Normal breath sounds.  Abdominal:     General: Abdomen is flat. Bowel sounds are normal.     Tenderness: There is no abdominal tenderness.  Musculoskeletal:     Left knee: Tenderness present over the lateral joint line and patellar tendon.  Skin:    General: Skin is warm and dry.  Neurological:     General: No focal deficit present.     Mental Status: She is alert and oriented to person, place, and time. Mental status is at baseline.     Cranial Nerves: Cranial nerves 2-12 are intact.     Motor: Motor function is intact.     Coordination: Coordination is intact.     Gait: Gait is intact.  Psychiatric:        Attention and Perception: Attention and perception normal.        Mood and Affect: Mood and affect normal.        Speech: Speech normal.        Behavior: Behavior normal. Behavior is cooperative.        Thought Content: Thought content normal.        Cognition and Memory: Cognition and memory normal.        Judgment: Judgment normal.    Assessment  Plan  Hypertension, elevated just had bp meds  - Plan: Lipid panel, Basic Metabolic  Panel (BMET) Stop norvasc 5 mg due to leg edema ?  Add bystolic 5 mg qd  Hyzaar 747-34.0 mg qd increase to 100-25    Pain and swelling of left knee - Plan: MR Knee Left  Wo Contrast Xray 07/20/20 negative   Hypokalemia - Plan: potassium chloride (MICRO-K) 10 MEQ CR capsule, Basic Metabolic Panel (BMET)  Prediabetes - Plan: Hemoglobin A1c    HM Flu shot  utd  Tdap rec will disc again at f/u with shingrix, prevnar and pna 23  -as of 01/16/22 declines pna vaccines  Declines Hep B/C/HIV  pfizer had 3/3 declines further   [facility] referred h/o 03/2016 colonospopy tubular/tubullovillous adenoma sch [facility] colonoscopy  -05/27/18 [facility] colonoscopy tubular adenoma x 2 neg high grade dysplasia f/u in 3 years will ask again when due for repeat   - I would say 2024 would be fine. Thanks  Per [facility]   mammo [place] 04/12/20 negative ordered   call and schedule   Pap out of age window    04/12/20 dexa osteopenia    [facility] 01/24/2019 aphakia right eye cataract right eye s/p surgery given pred phos, gatifloxacin, bromfenac qid and Durezol bid Dr. Marchia Meiers  S/p b/l cataract surgery   Rec healthy diet and exercise   Provider: Dr. Olivia Mackie McLean-Scocuzza-Internal Medicine

## 2022-01-16 NOTE — Patient Instructions (Signed)
Stop norvasc 5 mg daily

## 2022-01-19 NOTE — Progress Notes (Signed)
Inform pt colonoscopy due 05/2023 per GI

## 2022-01-23 NOTE — Progress Notes (Signed)
Colonoscopy is due [facility]  Is pt agreeable to referral now  ?

## 2022-01-23 NOTE — Progress Notes (Signed)
Colonoscopy due is pt agreeable to referral now?

## 2022-01-24 ENCOUNTER — Ambulatory Visit
Admission: RE | Admit: 2022-01-24 | Discharge: 2022-01-24 | Disposition: A | Discharge status: Home / Self Care | Payer: MEDICARE | Source: Ambulatory Visit | Attending: Internal Medicine | Admitting: Internal Medicine

## 2022-01-24 DIAGNOSIS — M25562 Pain in left knee: Secondary | ICD-10-CM | POA: Diagnosis not present

## 2022-01-24 DIAGNOSIS — M25462 Effusion, left knee: Secondary | ICD-10-CM | POA: Diagnosis not present

## 2022-02-08 ENCOUNTER — Ambulatory Visit (INDEPENDENT_AMBULATORY_CARE_PROVIDER_SITE_OTHER): Payer: MEDICARE | Admitting: *Deleted

## 2022-02-08 ENCOUNTER — Other Ambulatory Visit (INDEPENDENT_AMBULATORY_CARE_PROVIDER_SITE_OTHER): Payer: MEDICARE

## 2022-02-08 ENCOUNTER — Encounter: Payer: Self-pay | Admitting: Internal Medicine

## 2022-02-08 ENCOUNTER — Other Ambulatory Visit: Payer: Self-pay

## 2022-02-08 ENCOUNTER — Telehealth: Payer: Self-pay | Admitting: Internal Medicine

## 2022-02-08 VITALS — BP 138/82 | HR 64 | Resp 16

## 2022-02-08 DIAGNOSIS — E876 Hypokalemia: Secondary | ICD-10-CM | POA: Diagnosis not present

## 2022-02-08 DIAGNOSIS — R7303 Prediabetes: Secondary | ICD-10-CM | POA: Diagnosis not present

## 2022-02-08 DIAGNOSIS — I10 Essential (primary) hypertension: Secondary | ICD-10-CM | POA: Diagnosis not present

## 2022-02-08 DIAGNOSIS — E1159 Type 2 diabetes mellitus with other circulatory complications: Secondary | ICD-10-CM

## 2022-02-08 DIAGNOSIS — I152 Hypertension secondary to endocrine disorders: Secondary | ICD-10-CM

## 2022-02-08 LAB — BASIC METABOLIC PANEL
BUN: 11 mg/dL (ref 6–23)
CO2: 34 mEq/L — ABNORMAL HIGH (ref 19–32)
Calcium: 10.6 mg/dL — ABNORMAL HIGH (ref 8.4–10.5)
Chloride: 100 mEq/L (ref 96–112)
Creatinine, Ser: 0.93 mg/dL (ref 0.40–1.20)
GFR: 63.08 mL/min (ref >60.00)
Glucose, Bld: 162 mg/dL — ABNORMAL HIGH (ref 70–99)
Potassium: 3.7 mEq/L (ref 3.5–5.1)
Sodium: 139 mEq/L (ref 135–145)

## 2022-02-08 LAB — LIPID PANEL
Cholesterol: 279 mg/dL — ABNORMAL HIGH (ref 0–200)
HDL: 67.8 mg/dL (ref >39.00)
NonHDL: 211.64
Total CHOL/HDL Ratio: 4
Triglycerides: 223 mg/dL — ABNORMAL HIGH (ref 0.0–149.0)
VLDL: 44.6 mg/dL — ABNORMAL HIGH (ref 0.0–40.0)

## 2022-02-08 LAB — LDL CHOLESTEROL, DIRECT: Direct LDL: 187 mg/dL

## 2022-02-08 LAB — HEMOGLOBIN A1C: Hgb A1c MFr Bld: 7.2 % — ABNORMAL HIGH (ref 4.6–6.5)

## 2022-02-08 NOTE — Telephone Encounter (Signed)
Pt requesting results of MRI. Pt would like to know what's the next step. Pt requesting callback

## 2022-02-08 NOTE — Progress Notes (Signed)
Patient here for nurse visit BP check per order from office note 01/16/2022.   Patient reports compliance with prescribed BP medications: yes Bystolic 5 mg and Hyzaar 875-79. Last dose of BP medication: 0830  BP Readings from Last 3 Encounters:  02/08/22 138/82  01/16/22 140/88  10/13/21 140/90   Pulse Readings from Last 3 Encounters:  02/08/22 64  01/16/22 90  10/13/21 92      Patient verbalized understanding of Procedure of BP check and aware we contact if any changes.  Kerin Salen, RN

## 2022-02-09 NOTE — Telephone Encounter (Signed)
Left Message to call office advise patient for MRI results provider stated established with [facility] to call them for an appointment for [place].

## 2022-02-09 NOTE — Telephone Encounter (Signed)
Schedule pt appt for [facility] for 02/12/2022 @ 9am with Danae Orleans.

## 2022-02-12 ENCOUNTER — Other Ambulatory Visit: Payer: Self-pay

## 2022-02-12 DIAGNOSIS — M25562 Pain in left knee: Secondary | ICD-10-CM | POA: Diagnosis not present

## 2022-02-12 DIAGNOSIS — M2392 Unspecified internal derangement of left knee: Secondary | ICD-10-CM | POA: Diagnosis not present

## 2022-02-12 NOTE — Progress Notes (Signed)
Left message to return call 

## 2022-02-20 ENCOUNTER — Other Ambulatory Visit: Payer: Self-pay

## 2022-02-20 ENCOUNTER — Encounter: Payer: Self-pay | Admitting: Internal Medicine

## 2022-02-20 ENCOUNTER — Telehealth (INDEPENDENT_AMBULATORY_CARE_PROVIDER_SITE_OTHER): Payer: MEDICARE | Admitting: Internal Medicine

## 2022-02-20 VITALS — BP 134/77 | HR 56 | Ht 61.0 in | Wt 155.0 lb

## 2022-02-20 DIAGNOSIS — I152 Hypertension secondary to endocrine disorders: Secondary | ICD-10-CM | POA: Diagnosis not present

## 2022-02-20 DIAGNOSIS — E1159 Type 2 diabetes mellitus with other circulatory complications: Secondary | ICD-10-CM

## 2022-02-20 NOTE — Progress Notes (Signed)
Virtual Visit via Video Note ? ?I connected with Christine Cruz ? on 02/20/22 at  9:20 AM EST by a video enabled telemedicine application and verified that I am speaking with the correct person using two identifiers. ? Location patient: [place] ?Location provider:work or home office ?Persons participating in the virtual visit: patient, provider ? ?I discussed the limitations and requested verbal permission for telemedicine visit. The patient expressed understanding and agreed to proceed. ? ? ?HPI: ? ?Acute telemedicine visit for : ?Htn improved  on losartan hctz 711-/65 and bystolic 5 today 79038 HR 56 pulse w/o sx's  running lower on bystolic ?Dm 2 6.3 to 7.2 new dx she had not been taking lipitor 20 mg qhs and declines to start ozempic working on lifestyle changes since kitchen fized for the last 1 month drinking more water, eating out less unable to exercise declines CT ab/pelvis to look at pancreas for now and will try lifestyle changes  ? ?-Pertinent past medical history: see below ?-Pertinent medication allergies: ?Allergies  ?Allergen Reactions  ? Penicillin G Shortness Of Breath and Other (See Comments)  ?  Did it involve swelling of the face/tongue/throat, SOB, or low BP? Yes ?Did it involve sudden or severe rash/hives, skin peeling, or any reaction on the inside of your mouth or nose? Yes ?Did you need to seek medical attention at a hospital or doctor's office? Yes ?When did it last happen? About 20-30 years ago ?If all above answers are ?NO?, may proceed with cephalosporin use. ?  ? ?-COVID-19 vaccine status:  ?Immunization History  ?Administered Date(s) Administered  ? Fluad Quad(high Dose 65+) 11/19/2020, 10/13/2021  ? Influenza, High Dose Seasonal PF 10/01/2019  ? PFIZER(Purple Top)SARS-COV-2 Vaccination 01/30/2020, 02/20/2020, 09/28/2020  ? Pension scheme manager 63yr & up 12/21/2021  ? ? ? ?ROS: See pertinent positives and negatives per HPI. ? ?Past Medical History:  ?Diagnosis Date  ?  [place]   ? Diet Controlled  ? Essential familial hypercholesterolemia   ? History of blood transfusion   ? History of hypertension   ? Hypertension   ? Menopause   ? Osteopenia   ? Osteoporosis   ? Prediabetes   ? ? ?Past Surgical History:  ?Procedure Laterality Date  ? ABDOMINAL HYSTERECTOMY  1999  ? BREAST EXCISIONAL BIOPSY  1966  ? CATARACT EXTRACTION W/PHACO Right 01/13/2019  ? Procedure: CATARACT EXTRACTION PHACO AND INTRAOCULAR LENS PLACEMENT (IOC) RIGHT, vitrectomy;  Surgeon: HMarchia Meiers MD;  Location: [facility] ORS;  Service: Ophthalmology;  Laterality: Right;  [place]   ?CDE ?Fluid pack lot # [id number]  ? CATARACT EXTRACTION W/PHACO Left 04/26/2021  ? Procedure: CATARACT EXTRACTION PHACO AND INTRAOCULAR LENS PLACEMENT ([place] DIABETIC;  Surgeon: BLeandrew Koyanagi MD;  [place] [facility]  Service: Ophthalmology;  Laterality: Left;  CDE 10.74 ?1:14.6 minutes ?14.4%  ? COLONOSCOPY WITH PROPOFOL N/A 05/23/2018  ? Procedure: COLONOSCOPY WITH PROPOFOL;  Surgeon: SLollie Sails MD;  [facility];  Service: Endoscopy;  Laterality: N/A;  ? TONSILLECTOMY  1965  ? TUBAL LIGATION    ? ? ? ?Current Outpatient Medications:  ?  Ascorbic Acid (VITAMIN C PO), Take 1 tablet by mouth daily., Disp: , Rfl:  ?  atorvastatin (LIPITOR) 10 MG tablet, Take 1 tablet (10 mg total) by mouth daily. After 6 pm, Disp: 90 tablet, Rfl: 3 ?  Cholecalciferol (VITAMIN D-3) 125 MCG (5000 UT) TABS, Take 1 tablet by mouth in the morning and at bedtime. , Disp: ,  Rfl:  ?  losartan-hydrochlorothiazide (HYZAAR) 100-25 MG tablet, Take 1 tablet by mouth daily. In am D/c 100-12.5 mg qd, Disp: 90 tablet, Rfl: 3 ?  magnesium oxide (MAG-OX) 400 MG tablet, Take 400 mg by mouth daily., Disp: , Rfl:  ?  nebivolol (BYSTOLIC) 5 MG tablet, Take 1 tablet (5 mg total) by mouth daily., Disp: 90 tablet, Rfl: 3 ?  Omega-3 Fatty Acids (OMEGA 3 PO), Take 1 capsule by mouth 2 (two) times daily., Disp: ,  Rfl:  ?  potassium chloride (MICRO-K) 10 MEQ CR capsule, Take 2 capsules (20 mEq total) by mouth daily., Disp: 180 capsule, Rfl: 3 ?  calcium carbonate (OSCAL) 1500 (600 Ca) MG TABS tablet, Take 600 mg of elemental calcium by mouth 2 (two) times daily with a meal. (Patient not taking: Reported on 02/20/2022), Disp: , Rfl:  ? ?EXAM: ? ?VITALS per patient if applicable: ? ?GENERAL: alert, oriented, appears well and in no acute distress ? ?HEENT: atraumatic, conjunttiva clear, no obvious abnormalities on inspection of external nose and ears ? ?NECK: normal movements of the head and neck ? ?LUNGS: on inspection no signs of respiratory distress, breathing rate appears normal, no obvious gross SOB, gasping or wheezing ? ?CV: no obvious cyanosis ? ?MS: moves all visible extremities without noticeable abnormality ? ?PSYCH/NEURO: pleasant and cooperative, no obvious depression or anxiety, speech and thought processing grossly intact ? ?ASSESSMENT AND PLAN: ? ?Discussed the following assessment and plan: ? ?Hypertension associated with diabetes ([place]) ?BP improved on losartan hctz 100-25  ?Declines ozempic and ct ab/pelvis for now as diabetes tx and w/u  ?Lifestyle changes  ? ?HM ?Flu shot utd  ?Tdap rec will disc again at f/u with shingrix, prevnar and pna 23  ?-as of 01/16/22 declines pna vaccines  ?Declines Hep B/C/HIV  ?pfizer had 3/3 declines further ?  ?  ?[facility] referred h/o 03/2016 colonospopy tubular/tubullovillous adenoma sch [facility]  ?-05/27/18 [facility] colonoscopy tubular adenoma x 2 neg high grade dysplasia f/u in 3 years will ask again when due for repeat  ? - I would say 2024 would be fine. Thanks  ?Per [facility]  ?  ?mammo [place] 04/12/20 negative ordered  ? call and schedule rec again as of 02/20/22  ?  ?Pap out of age window  ?  ?04/12/20 dexa osteopenia  ?  ?[facility] 01/24/2019 aphakia right eye cataract right eye s/p surgery given pred phos, gatifloxacin, bromfenac qid and Durezol bid Dr. Marchia Meiers  ?S/p b/l cataract surgery  ?  ?Rec healthy diet and exercise ?-we discussed possible serious and likely etiologies, options for evaluation and workup, limitations of telemedicine visit vs in person visit, treatment, treatment risks and precautions. Pt is agreeable to treatment via telemedicine at this moment.  ?  ?I discussed the assessment and treatment plan with the patient. The patient was provided an opportunity to ask questions and all were answered. The patient agreed with the plan and demonstrated an understanding of the instructions. ?  ? ?Time spent 20 minutes ?Nino Glow McLean-Scocuzza, MD   ?

## 2022-03-12 ENCOUNTER — Other Ambulatory Visit (INDEPENDENT_AMBULATORY_CARE_PROVIDER_SITE_OTHER): Payer: MEDICARE

## 2022-03-12 ENCOUNTER — Other Ambulatory Visit: Payer: Self-pay

## 2022-03-12 LAB — BASIC METABOLIC PANEL
BUN: 12 mg/dL (ref 6–23)
CO2: 32 mEq/L (ref 19–32)
Calcium: 9.7 mg/dL (ref 8.4–10.5)
Chloride: 102 mEq/L (ref 96–112)
Creatinine, Ser: 0.88 mg/dL (ref 0.40–1.20)
GFR: 67.36 mL/min (ref >60.00)
Glucose, Bld: 167 mg/dL — ABNORMAL HIGH (ref 70–99)
Potassium: 3.6 mEq/L (ref 3.5–5.1)
Sodium: 141 mEq/L (ref 135–145)

## 2022-03-14 NOTE — Progress Notes (Signed)
Letter sent to schedule colonoscopy.

## 2022-06-11 DIAGNOSIS — H40053 Ocular hypertension, bilateral: Secondary | ICD-10-CM | POA: Diagnosis not present

## 2022-07-17 ENCOUNTER — Ambulatory Visit (INDEPENDENT_AMBULATORY_CARE_PROVIDER_SITE_OTHER): Payer: MEDICARE | Admitting: Internal Medicine

## 2022-07-17 ENCOUNTER — Encounter: Payer: Self-pay | Admitting: Internal Medicine

## 2022-07-17 VITALS — BP 130/64 | HR 60 | Temp 97.8°F | Ht 61.0 in | Wt 159.6 lb

## 2022-07-17 DIAGNOSIS — I152 Hypertension secondary to endocrine disorders: Secondary | ICD-10-CM | POA: Diagnosis not present

## 2022-07-17 DIAGNOSIS — S83207D Unspecified tear of unspecified meniscus, current injury, left knee, subsequent encounter: Secondary | ICD-10-CM

## 2022-07-17 DIAGNOSIS — E559 Vitamin D deficiency, unspecified: Secondary | ICD-10-CM

## 2022-07-17 DIAGNOSIS — E876 Hypokalemia: Secondary | ICD-10-CM | POA: Diagnosis not present

## 2022-07-17 DIAGNOSIS — Z86010 Personal history of colonic polyps: Secondary | ICD-10-CM

## 2022-07-17 DIAGNOSIS — E785 Hyperlipidemia, unspecified: Secondary | ICD-10-CM | POA: Diagnosis not present

## 2022-07-17 DIAGNOSIS — I10 Essential (primary) hypertension: Secondary | ICD-10-CM

## 2022-07-17 DIAGNOSIS — E1159 Type 2 diabetes mellitus with other circulatory complications: Secondary | ICD-10-CM

## 2022-07-17 DIAGNOSIS — Z1211 Encounter for screening for malignant neoplasm of colon: Secondary | ICD-10-CM | POA: Diagnosis not present

## 2022-07-17 LAB — COMPREHENSIVE METABOLIC PANEL
ALT: 22 U/L (ref 0–35)
AST: 20 U/L (ref 0–37)
Albumin: 4.8 g/dL (ref 3.5–5.2)
Alkaline Phosphatase: 52 U/L (ref 39–117)
BUN: 14 mg/dL (ref 6–23)
CO2: 29 mEq/L (ref 19–32)
Calcium: 10.1 mg/dL (ref 8.4–10.5)
Chloride: 101 mEq/L (ref 96–112)
Creatinine, Ser: 0.88 mg/dL (ref 0.40–1.20)
GFR: 67.2 mL/min (ref >60.00)
Glucose, Bld: 127 mg/dL — ABNORMAL HIGH (ref 70–99)
Potassium: 3.9 mEq/L (ref 3.5–5.1)
Sodium: 139 mEq/L (ref 135–145)
Total Bilirubin: 0.8 mg/dL (ref 0.2–1.2)
Total Protein: 7.2 g/dL (ref 6.0–8.3)

## 2022-07-17 LAB — CBC WITH DIFFERENTIAL/PLATELET
Basophils Absolute: 0 10*3/uL (ref 0.0–0.1)
Basophils Relative: 0.4 % (ref 0.0–3.0)
Eosinophils Absolute: 0.1 10*3/uL (ref 0.0–0.7)
Eosinophils Relative: 1.3 % (ref 0.0–5.0)
HCT: 41.7 % (ref 36.0–46.0)
Hemoglobin: 13.9 g/dL (ref 12.0–15.0)
Lymphocytes Relative: 23.6 % (ref 12.0–46.0)
Lymphs Abs: 1.3 10*3/uL (ref 0.7–4.0)
MCHC: 33.3 g/dL (ref 30.0–36.0)
MCV: 93.5 fl (ref 78.0–100.0)
Monocytes Absolute: 0.5 10*3/uL (ref 0.1–1.0)
Monocytes Relative: 8.4 % (ref 3.0–12.0)
Neutro Abs: 3.6 10*3/uL (ref 1.4–7.7)
Neutrophils Relative %: 66.3 % (ref 43.0–77.0)
Platelets: 129 10*3/uL — ABNORMAL LOW (ref 150.0–400.0)
RBC: 4.46 Mil/uL (ref 3.87–5.11)
RDW: 13.8 % (ref 11.5–15.5)
WBC: 5.5 10*3/uL (ref 4.0–10.5)

## 2022-07-17 LAB — LIPID PANEL
Cholesterol: 185 mg/dL (ref 0–200)
HDL: 70.4 mg/dL (ref >39.00)
LDL Cholesterol: 99 mg/dL (ref 0–99)
NonHDL: 114.38
Total CHOL/HDL Ratio: 3
Triglycerides: 79 mg/dL (ref 0.0–149.0)
VLDL: 15.8 mg/dL (ref 0.0–40.0)

## 2022-07-17 LAB — VITAMIN D 25 HYDROXY (VIT D DEFICIENCY, FRACTURES): VITD: 35.29 ng/mL (ref 30.00–100.00)

## 2022-07-17 LAB — HEMOGLOBIN A1C: Hgb A1c MFr Bld: 6.3 % (ref 4.6–6.5)

## 2022-07-17 MED ORDER — POTASSIUM CHLORIDE ER 10 MEQ PO CPCR: 20.0000 meq | ORAL_CAPSULE | Freq: Every day | ORAL | 3 refills | Status: DC

## 2022-07-17 MED ORDER — NEBIVOLOL HCL 5 MG PO TABS
5.0000 mg | ORAL_TABLET | Freq: Every day | ORAL | 3 refills | Status: DC
Start: 2022-07-17 — End: 2023-01-09

## 2022-07-17 MED ORDER — ATORVASTATIN CALCIUM 10 MG PO TABS: 10.0000 mg | ORAL_TABLET | Freq: Every day | ORAL | 3 refills | Status: DC

## 2022-07-17 MED ORDER — LOSARTAN POTASSIUM-HCTZ 100-25 MG PO TABS: 1.0000 | ORAL_TABLET | Freq: Every day | ORAL | 3 refills | Status: DC

## 2022-07-17 NOTE — Progress Notes (Signed)
Chief Complaint  Patient presents with   Follow-up    3-6 (5) month f/u   F/u  1. Htn hyzaar 294-76 mg qd, bystolic 5 mg qd controlled and improved and dm 2 walking 3 miles per day    Review of Systems  Constitutional:  Negative for weight loss.  HENT:  Negative for hearing loss.   Eyes:  Negative for blurred vision.  Respiratory:  Negative for shortness of breath.   Cardiovascular:  Negative for chest pain.  Gastrointestinal:  Negative for abdominal pain and blood in stool.  Genitourinary:  Negative for dysuria.  Musculoskeletal:  Negative for falls and joint pain.  Skin:  Negative for rash.  Neurological:  Negative for headaches.  Psychiatric/Behavioral:  Negative for depression.    Past Medical History:  Diagnosis Date   Diabetes mellitus without complication ([place])    Diet Controlled   Essential familial hypercholesterolemia    History of blood transfusion    History of hypertension    Hypertension    Menopause    Osteopenia    Osteoporosis    Prediabetes    Tear of left meniscus as current injury, subsequent encounter    as of 07/17/22 f/u ortho   Past Surgical History:  Procedure Laterality Date   ABDOMINAL HYSTERECTOMY  1999   BREAST EXCISIONAL BIOPSY  1966   CATARACT EXTRACTION W/PHACO Right 01/13/2019   Procedure: CATARACT EXTRACTION PHACO AND INTRAOCULAR LENS PLACEMENT ([facility]) RIGHT, vitrectomy;  Surgeon: Marchia Meiers, MD;  Location: [facility];  Service: Ophthalmology;  Laterality: Right;  [place]    CDE Fluid pack lot # [id number]   CATARACT EXTRACTION W/PHACO Left 04/26/2021   Procedure: CATARACT EXTRACTION PHACO AND INTRAOCULAR LENS PLACEMENT ([place]) LEFT DIABETIC;  Surgeon: Leandrew Koyanagi, MD;  Location: [place];  Service: Ophthalmology;  Laterality: Left;  CDE 10.74 1:14.6 minutes 14.4%   COLONOSCOPY WITH PROPOFOL N/A 05/23/2018   Procedure: COLONOSCOPY WITH PROPOFOL;  Surgeon: Lollie Sails, MD;  Location: [facility] ENDOSCOPY;  Service: Endoscopy;   Laterality: N/A;   TONSILLECTOMY  1965   TUBAL LIGATION     Family History  Problem Relation Age of Onset   Arthritis Mother    Heart disease Mother    Stroke Mother    Hypertension Mother    Kidney disease Mother    Diabetes Mother    Breast cancer Sister 63   Diabetes Sister    Diabetes Sister    Diabetes Sister    Diabetes Sister    Social History   Socioeconomic History   Marital status: Married    Spouse name: Not on file   Number of children: Not on file   Years of education: Not on file   Highest education level: Not on file  Occupational History   Not on file  Tobacco Use   Smoking status: Former    Packs/day: 1.00    Types: Cigarettes    Quit date: 04/27/2006    Years since quitting: 16.2   Smokeless tobacco: Never   Tobacco comments:    former smoker age [age] took break 59 year 1/2 ppd max no FH lung cancer   Vaping Use   Vaping Use: Never used  Substance and Sexual Activity   Alcohol use: Not Currently    Alcohol/week: 0.0 standard drinks of alcohol    Comment: occ   Drug use: No   Sexual activity: Not on file  Other Topics Concern   Not on file  Social History Narrative  Unemployed   2 daughters 1 in [place] another [place]    9 siblings(8 sisters  FH htn, dm, 1 brother)   Social Determinants of Health   Financial Resource Strain: Low Risk  (08/14/2021)   Overall Financial Resource Strain (CARDIA)    Difficulty of Paying Living Expenses: Not hard at all  Food Insecurity: No Food Insecurity (08/14/2021)   Hunger Vital Sign    Worried About Running Out of Food in the Last Year: Never true    [place] in the Last Year: Never true  Transportation Needs: No Transportation Needs (08/14/2021)   PRAPARE - Hydrologist (Medical): No    Lack of Transportation (Non-Medical): No  Physical Activity: Not on file  Stress: No Stress Concern Present (08/14/2021)   [place]    Feeling of Stress : Not at all  Social Connections: Unknown (08/14/2021)   Social Connection and Isolation Panel [NHANES]    Frequency of Communication with Friends and Family: Not on file    Frequency of Social Gatherings with Friends and Family: Not on file    Attends Religious Services: Not on file    Active Member of Clubs or Organizations: Not on file    Attends Archivist Meetings: Not on file    Marital Status: Married  Intimate Partner Violence: Not At Risk (08/14/2021)   Humiliation, Afraid, Rape, and Kick questionnaire    Fear of Current or Ex-Partner: No    Emotionally Abused: No    Physically Abused: No    Sexually Abused: No   Current Meds  Medication Sig   Ascorbic Acid (VITAMIN C PO) Take 1 tablet by mouth daily.   calcium carbonate (OSCAL) 1500 (600 Ca) MG TABS tablet Take 600 mg of elemental calcium by mouth 2 (two) times daily with a meal.   Cholecalciferol (VITAMIN D-3) 125 MCG (5000 UT) TABS Take 1 tablet by mouth in the morning and at bedtime.    magnesium oxide (MAG-OX) 400 MG tablet Take 400 mg by mouth daily.   Omega-3 Fatty Acids (OMEGA 3 PO) Take 1 capsule by mouth 2 (two) times daily.   [DISCONTINUED] atorvastatin (LIPITOR) 10 MG tablet Take 1 tablet (10 mg total) by mouth daily. After 6 pm   [DISCONTINUED] losartan-hydrochlorothiazide (HYZAAR) 100-25 MG tablet Take 1 tablet by mouth daily. In am D/c 100-12.5 mg qd   [DISCONTINUED] nebivolol (BYSTOLIC) 5 MG tablet Take 1 tablet (5 mg total) by mouth daily.   [DISCONTINUED] potassium chloride (MICRO-K) 10 MEQ CR capsule Take 2 capsules (20 mEq total) by mouth daily.   Allergies  Allergen Reactions   Penicillin G Shortness Of Breath and Other (See Comments)    Did it involve swelling of the face/tongue/throat, SOB, or low BP? Yes Did it involve sudden or severe rash/hives, skin peeling, or any reaction on the inside of your mouth or nose? Yes Did you need to seek  medical attention at a hospital or doctor's office? Yes When did it last happen? About 20-30 years ago If all above answers are "NO", may proceed with cephalosporin use.    No results found for this or any previous visit (from the past 2160 hour(s)). Objective  Body mass index is 30.16 kg/m. Wt Readings from Last 3 Encounters:  07/17/22 159 lb 9.6 oz (72.4 kg)  02/20/22 155 lb (70.3 kg)  01/16/22 163 lb 9.6 oz (74.2  kg)   Temp Readings from Last 3 Encounters:  07/17/22 97.8 F (36.6 C) (Oral)  01/16/22 98 F (36.7 C) (Oral)  10/13/21 98.1 F (36.7 C) (Oral)   BP Readings from Last 3 Encounters:  07/17/22 130/64  02/20/22 134/77  02/08/22 138/82   Pulse Readings from Last 3 Encounters:  07/17/22 60  02/20/22 (!) 56  02/08/22 64    Physical Exam Vitals and nursing note reviewed.  Constitutional:      Appearance: Normal appearance. She is well-developed and well-groomed.  HENT:     Head: Normocephalic and atraumatic.  Eyes:     Conjunctiva/sclera: Conjunctivae normal.     Pupils: Pupils are equal, round, and reactive to light.  Cardiovascular:     Rate and Rhythm: Normal rate and regular rhythm.     Heart sounds: Normal heart sounds. No murmur heard. Pulmonary:     Effort: Pulmonary effort is normal.     Breath sounds: Normal breath sounds.  Abdominal:     General: Abdomen is flat. Bowel sounds are normal.     Tenderness: There is no abdominal tenderness.  Musculoskeletal:        General: No tenderness.  Skin:    General: Skin is warm and dry.  Neurological:     General: No focal deficit present.     Mental Status: She is alert and oriented to person, place, and time. Mental status is at baseline.     Cranial Nerves: Cranial nerves 2-12 are intact.     Motor: Motor function is intact.     Coordination: Coordination is intact.     Gait: Gait is intact.  Psychiatric:        Attention and Perception: Attention and perception normal.        Mood and Affect:  Mood and affect normal.        Speech: Speech normal.        Behavior: Behavior normal. Behavior is cooperative.        Thought Content: Thought content normal.        Cognition and Memory: Cognition and memory normal.        Judgment: Judgment normal.     Assessment  Plan  Hypertension associated with diabetes ([place]) - Plan: Comprehensive metabolic panel, Lipid panel, CBC with Differential/Platelet, Hemoglobin A1c, Microalbumin / creatinine urine ratio, CANCELED: Microalbumin / creatinine urine rati  bystolic 5 mg qd controlled and improved and dm 2 walking 3 miles per day Pt prev declined meds and wanted lifestyle changes   Hyperlipidemia, unspecified hyperlipidemia type - Plan: atorvastatin (LIPITOR) 10 MG tablet  Tear of left meniscus as current injury, subsequent encounter F/u ortho doing better able to do physical exercise walking 3 miles daily with daughter   History of colon polyps - Plan: Ambulatory referral to Gastroenterology Encounter for screening colonoscopy - Plan: Ambulatory referral to Gastroenterology  Hypokalemia - Plan: potassium chloride (MICRO-K) 10 MEQ CR capsule   HM Flu shot utd  Rec disc Tdap rec will disc again at f/u with shingrix, prevnar 13 and pna 20 -as of 01/16/22 declines pna vaccines  Declines Hep B/C/HIV  pfizer had 4/4      [facility] referred h/o 03/2016 colonospopy tubular/tubullovillous adenoma sch Friday colonoscopy  -05/27/18 [facility] colonoscopy tubular adenoma x 2 neg high grade dysplasia f/u in 3 years will ask again when due for repeat   - I would say 2024 would be fine. Thanks  Per [facility]  -referred as of 07/17/22  mammo [place] 04/12/20 negative ordered   call and schedule rec again as of 02/20/22, 07/17/22   Pap out of age window    04/12/20 dexa osteopenia    [facility] 01/24/2019 aphakia right eye cataract right eye s/p surgery given pred phos, gatifloxacin, bromfenac qid and Durezol bid Dr. Marchia Meiers  S/p b/l cataract  surgery  Seen recently [facility] 05/2022 f/u in 46month appt 11/2022    Rec healthy diet and exercise  Provider: Dr. TOlivia MackieMcLean-Scocuzza-Internal Medicine

## 2022-07-17 NOTE — Patient Instructions (Addendum)
Mammogram Christine Cruz [address] Address: [address], [place], [place] [address] Hours:  Open ? Closes 5?PM Phone: [phone]  Dr. Volanda Napoleon new PCP   Thrombocytopenia Thrombocytopenia is a condition in which there are a low number of platelets in the blood. Platelets are also called thrombocytes. Platelets are parts of blood that stick together and form a clot to help the body stop bleeding after an injury. If you have too few platelets, your blood may have trouble clotting. This may cause you to bleed and bruise very easily. Some cases of thrombocytopenia are mild while others are more severe. What are the causes? This condition is caused by a low number of platelets in your blood. There are three main reasons for this: Your body not making enough platelets. This may be caused by: Bone marrow diseases. This include aplastic anemia, leukemia, and myelodysplastic anemia. Congenital thrombocytopenia. This is a condition that is passed from parent to child (inherited). Certain cancer treatments, including chemotherapy and radiation therapy. Infections from bacteria or viruses. Alcohol use disorder and alcoholism. Platelets not being released in the blood. This is called platelet sequestration and it can happen due to: An overactive spleen (hypersplenism). The spleen gathers up platelets from circulation, meaning that the platelets are not available to help with clotting your blood. The spleen can be enlarged because of scarring or other conditions. Gaucher disease. Your body destroying platelets too quickly. This may be caused by: An autoimmune disease that causes immune thrombocytopenia (ITP). ITP is sometimes associated with other autoimmune conditions such as lupus. Certain medicines, such as blood thinners. Certain blood clotting or bleeding disorders. Exposure to toxic chemicals, such as pesticides, lead, benzene, and arsenic. Pregnancy. What are the signs or symptoms? Symptoms  of this condition are the result of poor blood clotting. They will vary depending on how low the platelet counts are. Symptoms may include: Bruising easily. Bleeding from the mouth or nose. Heavy menstrual periods. Blood in the urine, stool (feces), or vomit. Purplish-red discolorations on the skin (purpura). A rash that looks like pinpoint, purplish-red spots (petechiae) on the lower legs. How is this diagnosed?  This condition may be diagnosed with blood tests and a physical exam. You may also have other tests, including: A sample of bone marrow (biopsy) may be removed to look for the original cells that make platelets. An ultrasound or CT scan of the abdomen to check for an enlarged spleen, enlarged lymph nodes, or liver problems. How is this treated? Treatment for this condition depends on the cause. Treatment may include: Treatment of another condition that is causing the low platelet count. Medicines to help protect your platelets from being destroyed. A replacement (transfusion) of platelets to stop or prevent bleeding. Surgery to remove the spleen. Follow these instructions at home: Medicines Take over-the-counter and prescription medicines only as told by your health care provider. Do not take any medicines that contain aspirin or NSAIDs, such as ibuprofen. These medicines increase your risk for dangerous bleeding. Activity Avoid activities that could cause injury or bruising, and follow instructions about how to prevent falls. Do not play contact sports. Ask your health care provider what activities are safe for you. Take extra care to protect yourself from burns when ironing or cooking. Take extra care not to cut yourself when you shave or when you use scissors, needles, knives, and other tools. General instructions  Check your skin and the inside of your mouth for bruising or bleeding as told by your health  care provider. Wear a medical alert bracelet that says that you  have a bleeding disorder. This can help you get the treatment you need in case of emergency. Check your urine and stool for blood as told by your health care provider. Do not drink alcohol. If you do drink alcohol, limit the amount that you drink. Minimize contact with toxic chemicals. Tell all your health care providers, including dental care providers and eye doctors, about your condition. Make sure to tell dental care providers before you have any procedure done, including dental cleanings. Keep all follow-up visits. This is important. Contact a health care provider if: You have unexplained bruising. You have new symptoms. You have symptoms that get worse. You have a fever. Get help right away if: You have severe bleeding from anywhere on your body. You have blood in your vomit, urine, or stool. You have an injury to your head. You have a sudden, severe headache. Summary Thrombocytopenia is a condition in which you have a low number of platelets in the blood. Platelets are parts of blood that stick together to form a clot. Symptoms of this condition are the result of poor blood clotting and may include bruising easily, bleeding from the nose or mouth, petechiae, and purpura. This condition may be diagnosed with blood tests and a physical exam. Treatment for this condition depends on the cause. This information is not intended to replace advice given to you by your health care provider. Make sure you discuss any questions you have with your health care provider. Document Revised: 05/18/2021 Document Reviewed: 05/18/2021 Elsevier Patient Education  [place].

## 2022-07-18 LAB — MICROALBUMIN / CREATININE URINE RATIO
Creatinine, Urine: 23 mg/dL (ref 20–275)
Microalb Creat Ratio: 9 mcg/mg creat (ref <30)
Microalb, Ur: 0.2 mg/dL

## 2022-07-19 ENCOUNTER — Encounter: Payer: Self-pay | Admitting: Internal Medicine

## 2022-07-30 ENCOUNTER — Encounter: Payer: Self-pay | Admitting: Family

## 2022-07-30 ENCOUNTER — Telehealth (INDEPENDENT_AMBULATORY_CARE_PROVIDER_SITE_OTHER): Payer: MEDICARE | Admitting: Family

## 2022-07-30 DIAGNOSIS — U071 COVID-19: Secondary | ICD-10-CM | POA: Insufficient documentation

## 2022-07-30 HISTORY — DX: COVID-19: U07.1

## 2022-07-30 MED ORDER — MOLNUPIRAVIR EUA 200MG CAPSULE: 4.0000 | ORAL_CAPSULE | Freq: Two times a day (BID) | ORAL | 0 refills | Status: AC

## 2022-07-30 NOTE — Patient Instructions (Signed)
I do recommend over the counter mucinex (without the DM)- this is generic guaifenesin.   Robitussin is ok to continue with.   Your COVID test is positive. You should remain isolated and quarantine for at least 5 days from start of symptoms. You must be feeling better and be fever free without any fever reducers for at least 24 hours as well. You should wear a mask at all times when out of your home or around others for 5 days after leaving isolation.  Your household contacts should be tested as well as work contacts. If you feel worse or have increasing shortness of breath, you should be seen in person at urgent care or the emergency room.   Due to recent changes in healthcare laws, you may see results of your imaging and/or laboratory studies on MyChart before I have had a chance to review them.  I understand that in some cases there may be results that are confusing or concerning to you. Please understand that not all results are received at the same time and often I may need to interpret multiple results in order to provide you with the best plan of care or course of treatment. Therefore, I ask that you please give me 2 business days to thoroughly review all your results before contacting my office for clarification. Should we see a critical lab result, you will be contacted sooner.   It was a pleasure seeing you today! Please do not hesitate to reach out with any questions and or concerns.  Regards,   Eugenia Pancoast FNP-C

## 2022-07-30 NOTE — Progress Notes (Signed)
MyChart Video Visit    Virtual Visit via Video Note   This visit type was conducted due to national recommendations for restrictions regarding the COVID-19 Pandemic (e.g. social distancing) in an effort to limit this patient's exposure and mitigate transmission in our community. This patient is at least at moderate risk for complications without adequate follow up. This format is felt to be most appropriate for this patient at this time. Physical exam was limited by quality of the video and audio technology used for the visit. CMA was able to get the patient set up on a video visit.  Patient location: Home. Patient and provider in visit Provider location: Office  I discussed the limitations of evaluation and management by telemedicine and the availability of in person appointments. The patient expressed understanding and agreed to proceed.  Visit Date: 07/30/2022  Today's healthcare provider: Eugenia Pancoast, FNP     Subjective:    Patient ID: Christine Cruz, female    DOB: Feb 04, 1951, 71 y.o.   MRN: [id number]  Chief Complaint  Patient presents with   Covid Positive    Today     HPI  Pt here today via video visit with concerns.   Tested positive for covid this am 8/14.  Started with covid symptoms two days ago on Saturday.  Intense headache, sinus pressure, body aches, nasal congestion.  Cough is productive, pt states feels it across her entire body with the cough.  Productive cough that is clear.  Also with sore throat.   Decreased appetite, doesn't want to eat.  Hoarse.  Feels harder to take a deep breath.  Robitussin helping slightly with the cough.   Past Medical History:  Diagnosis Date   Diabetes mellitus without complication ([place])    Diet Controlled   Essential familial hypercholesterolemia    History of blood transfusion    History of hypertension    Hypertension    Menopause    Osteopenia    Osteoporosis    Prediabetes    Tear of left meniscus as  current injury, subsequent encounter    as of 07/17/22 f/u ortho    Past Surgical History:  Procedure Laterality Date   ABDOMINAL HYSTERECTOMY  1999   BREAST EXCISIONAL BIOPSY  1966   CATARACT EXTRACTION W/PHACO Right 01/13/2019   Procedure: CATARACT EXTRACTION PHACO AND INTRAOCULAR LENS PLACEMENT (IOC) RIGHT, vitrectomy;  Surgeon: Marchia Meiers, MD;  Location: [facility] ORS;  Service: Ophthalmology;  Laterality: Right;  [place]    CDE Fluid pack lot # [id number]   CATARACT EXTRACTION W/PHACO Left 04/26/2021   Procedure: CATARACT EXTRACTION PHACO AND INTRAOCULAR LENS PLACEMENT ([place]) LEFT DIABETIC;  Surgeon: Leandrew Koyanagi, MD;  Location: [facility];  Service: Ophthalmology;  Laterality: Left;  CDE 10.74 1:14.6 minutes 14.4%   COLONOSCOPY WITH PROPOFOL N/A 05/23/2018   Procedure: COLONOSCOPY WITH PROPOFOL;  Surgeon: Lollie Sails, MD;  Location: [facility] ENDOSCOPY;  Service: Endoscopy;  Laterality: N/A;   TONSILLECTOMY  1965   TUBAL LIGATION      Family History  Problem Relation Age of Onset   Arthritis Mother    Heart disease Mother    Stroke Mother    Hypertension Mother    Kidney disease Mother    Diabetes Mother    Breast cancer Sister 76   Diabetes Sister    Diabetes Sister    Diabetes Sister    Diabetes Sister     Social History   Socioeconomic History   Marital status: Married  Spouse name: Not on file   Number of children: Not on file   Years of education: Not on file   Highest education level: Not on file  Occupational History   Not on file  Tobacco Use   Smoking status: Former    Packs/day: 1.00    Types: Cigarettes    Quit date: 04/27/2006    Years since quitting: 16.2   Smokeless tobacco: Never   Tobacco comments:    former smoker age [age] took break 74 year 1/2 ppd max no FH lung cancer   Vaping Use   Vaping Use: Never used  Substance and Sexual Activity   Alcohol use: Not Currently    Alcohol/week: 0.0 standard drinks of alcohol    Comment:  occ   Drug use: No   Sexual activity: Not on file  Other Topics Concern   Not on file  Social History Narrative   Unemployed   2 daughters 1 in [place] another [place]    9 siblings(8 sisters  FH htn, dm, 1 brother)   Social Determinants of Health   Financial Resource Strain: Low Risk  (08/14/2021)   Overall Financial Resource Strain (CARDIA)    Difficulty of Paying Living Expenses: Not hard at all  Food Insecurity: No Food Insecurity (08/14/2021)   Hunger Vital Sign    Worried About Running Out of Food in the Last Year: Never true    [place] in the Last Year: Never true  Transportation Needs: No Transportation Needs (08/14/2021)   PRAPARE - Hydrologist (Medical): No    Lack of Transportation (Non-Medical): No  Physical Activity: Not on file  Stress: No Stress Concern Present (08/14/2021)   [place]    Feeling of Stress : Not at all  Social Connections: Unknown (08/14/2021)   Social Connection and Isolation Panel [NHANES]    Frequency of Communication with Friends and Family: Not on file    Frequency of Social Gatherings with Friends and Family: Not on file    Attends Religious Services: Not on file    Active Member of Clubs or Organizations: Not on file    Attends Archivist Meetings: Not on file    Marital Status: Married  Intimate Partner Violence: Not At Risk (08/14/2021)   Humiliation, Afraid, Rape, and Kick questionnaire    Fear of Current or Ex-Partner: No    Emotionally Abused: No    Physically Abused: No    Sexually Abused: No    Outpatient Medications Prior to Visit  Medication Sig Dispense Refill   Ascorbic Acid (VITAMIN C PO) Take 1 tablet by mouth daily.     atorvastatin (LIPITOR) 10 MG tablet Take 1 tablet (10 mg total) by mouth daily. After 6 pm 90 tablet 3   calcium carbonate (OSCAL) 1500 (600 Ca) MG TABS tablet Take 600 mg of  elemental calcium by mouth 2 (two) times daily with a meal.     Cholecalciferol (VITAMIN D-3) 125 MCG (5000 UT) TABS Take 1 tablet by mouth in the morning and at bedtime.      losartan-hydrochlorothiazide (HYZAAR) 100-25 MG tablet Take 1 tablet by mouth daily. In am D/c 100-12.5 mg qd 90 tablet 3   magnesium oxide (MAG-OX) 400 MG tablet Take 400 mg by mouth daily.     nebivolol (BYSTOLIC) 5 MG tablet Take 1 tablet (5 mg total) by mouth daily. [place]  tablet 3   Omega-3 Fatty Acids (OMEGA 3 PO) Take 1 capsule by mouth 2 (two) times daily.     potassium chloride (MICRO-K) 10 MEQ CR capsule Take 2 capsules (20 mEq total) by mouth daily. 180 capsule 3   No facility-administered medications prior to visit.    Allergies  Allergen Reactions   Penicillin G Shortness Of Breath and Other (See Comments)    Did it involve swelling of the face/tongue/throat, SOB, or low BP? Yes Did it involve sudden or severe rash/hives, skin peeling, or any reaction on the inside of your mouth or nose? Yes Did you need to seek medical attention at a hospital or doctor's office? Yes When did it last happen? About 20-30 years ago If all above answers are "NO", may proceed with cephalosporin use.     Review of Systems     Objective:    Physical Exam Constitutional:      Appearance: Normal appearance. She is normal weight.  Pulmonary:     Effort: Pulmonary effort is normal.  Neurological:     General: No focal deficit present.     Mental Status: She is alert and oriented to person, place, and time. Mental status is at baseline.  Psychiatric:        Mood and Affect: Mood normal.        Behavior: Behavior normal.        Thought Content: Thought content normal.        Judgment: Judgment normal.     There were no vitals taken for this visit. Wt Readings from Last 3 Encounters:  07/17/22 159 lb 9.6 oz (72.4 kg)  02/20/22 155 lb (70.3 kg)  01/16/22 163 lb 9.6 oz (74.2 kg)       Assessment & Plan:   Problem  List Items Addressed This Visit       Other   COVID-19 - Primary    Advised of CDC guidelines for self isolation/ ending isolation.  Advised of safe practice guidelines. Symptom Tier reviewed.  Encouraged to monitor for any worsening symptoms; watch for increased shortness of breath, weakness, and signs of dehydration. Advised when to seek emergency care.  Instructed to rest and hydrate well.  Advised to leave the house during recommended isolation period, only if it is necessary to seek medical care  Pt considered high risk for hospitalization. have decided pt is a candidate for antiviral and pt agrees that she would like to take this. I have sent in RX for molnupiravir 200 mg capsules to be taken as directed.  Continue with robitussin.  Recommend starting mucinex (without DM)      Relevant Medications   molnupiravir EUA (LAGEVRIO) 200 mg CAPS capsule    I am having Fortunato Curling. Presas start on [employer]. I am also having her maintain her Ascorbic Acid (VITAMIN C PO), Omega-3 Fatty Acids (OMEGA 3 PO), Vitamin D-3, calcium carbonate, magnesium oxide, potassium chloride, nebivolol, losartan-hydrochlorothiazide, and atorvastatin.  Meds ordered this encounter  Medications   molnupiravir EUA (LAGEVRIO) 200 mg CAPS capsule    Sig: Take 4 capsules (800 mg total) by mouth 2 (two) times daily for 5 days.    Dispense:  40 capsule    Refill:  0    Order Specific Question:   Supervising Provider    Answer:   BEDSOLE, AMY E [[id number]]    I discussed the assessment and treatment plan with the patient. The patient was provided an opportunity to ask questions and all  were answered. The patient agreed with the plan and demonstrated an understanding of the instructions.   The patient was advised to call back or seek an in-person evaluation if the symptoms worsen or if the condition fails to improve as anticipated.  I provided 15 minutes of face-to-face time during this encounter.   Eugenia Pancoast,  [place] at [place] [phone] (phone) [phone] (fax)  [place]

## 2022-07-30 NOTE — Assessment & Plan Note (Addendum)
Advised of CDC guidelines for self isolation/ ending isolation.  Advised of safe practice guidelines. Symptom Tier reviewed.  Encouraged to monitor for any worsening symptoms; watch for increased shortness of breath, weakness, and signs of dehydration. Advised when to seek emergency care.  Instructed to rest and hydrate well.  Advised to leave the house during recommended isolation period, only if it is necessary to seek medical care  Pt considered high risk for hospitalization. have decided pt is a candidate for antiviral and pt agrees that she would like to take this. I have sent in RX for molnupiravir 200 mg capsules to be taken as directed.  Continue with robitussin.  Recommend starting mucinex (without DM)

## 2022-08-14 ENCOUNTER — Telehealth: Payer: Self-pay | Admitting: Internal Medicine

## 2022-08-14 NOTE — Telephone Encounter (Signed)
Copied from [place] [id number]. Topic: Medicare AWV >> Aug 14, 2022 10:54 AM Devoria Glassing wrote: Reason for CRM: Left message for patient to schedule Annual Wellness Visit.  Please schedule with Nurse Health Advisor Denisa O'Brien-Blaney, LPN at [facility]. This appt can be telephone or office visit.  Please call [phone] ask for [facility]

## 2022-08-23 ENCOUNTER — Telehealth: Payer: Self-pay | Admitting: Internal Medicine

## 2022-08-23 NOTE — Telephone Encounter (Signed)
Copied from [place] [id number]. Topic: Medicare AWV >> Aug 23, 2022  1:53 PM Devoria Glassing wrote: Reason for CRM: Left message for patient to schedule Annual Wellness Visit.  Please schedule with Nurse Health Advisor Denisa O'Brien-Blaney, LPN at [facility]. This appt can be telephone or office visit.  Please call [phone] ask for [facility]

## 2022-09-26 ENCOUNTER — Ambulatory Visit (INDEPENDENT_AMBULATORY_CARE_PROVIDER_SITE_OTHER): Payer: MEDICARE

## 2022-09-26 ENCOUNTER — Other Ambulatory Visit: Payer: Self-pay | Admitting: Internal Medicine

## 2022-09-26 VITALS — Ht 61.0 in | Wt 159.0 lb

## 2022-09-26 DIAGNOSIS — Z0000 Encounter for general adult medical examination without abnormal findings: Secondary | ICD-10-CM

## 2022-09-26 DIAGNOSIS — Z1231 Encounter for screening mammogram for malignant neoplasm of breast: Secondary | ICD-10-CM

## 2022-09-26 NOTE — Patient Instructions (Addendum)
[name] , Thank you for taking time to come for your Medicare Wellness Visit. I appreciate your ongoing commitment to your health goals. Please review the following plan we discussed and let me know if I can assist you in the future.   These are the goals we discussed:  Goals       Patient Stated     Weight (lb) < 138 lb (62.6 kg) (pt-stated)      I would like to lose about 15 pounds Walk 5 days weekly for exercise Take a Master class in gardening Learn a new language        This is a list of the screening recommended for you and due dates:  Health Maintenance  Topic Date Due   Mammogram  04/12/2022   Pneumonia Vaccine (1 - PCV) 01/16/2023*   Flu Shot  03/17/2023*   Hemoglobin A1C  01/17/2023   Eye exam for diabetics  06/01/2023   Yearly kidney function blood test for diabetes  07/18/2023   Yearly kidney health urinalysis for diabetes  07/18/2023   Colon Cancer Screening  05/23/2028   DEXA scan (bone density measurement)  Completed   HPV Vaccine  Aged Out   Complete foot exam   Discontinued   Tetanus Vaccine  Discontinued   COVID-19 Vaccine  Discontinued   Hepatitis C Screening: USPSTF Recommendation to screen - Ages 57-79 yo.  Discontinued   Zoster (Shingles) Vaccine  Discontinued  *Topic was postponed. The date shown is not the original due date.   Advanced directives: End of life planning; Advance aging; Advanced directives discussed.  Copy of current HCPOA/Living Will requested.    Conditions/risks identified: none new  Next appointment: Follow up in one year for your annual wellness visit    Preventive Care 65 Years and Older, Female Preventive care refers to lifestyle choices and visits with your health care provider that can promote health and wellness. What does preventive care include? A yearly physical exam. This is also called an annual well check. Dental exams once or twice a year. Routine eye exams. Ask your health care provider how often you should have  your eyes checked. Personal lifestyle choices, including: Daily care of your teeth and gums. Regular physical activity. Eating a healthy diet. Avoiding tobacco and drug use. Limiting alcohol use. Practicing safe sex. Taking low-dose aspirin every day. Taking vitamin and mineral supplements as recommended by your health care provider. What happens during an annual well check? The services and screenings done by your health care provider during your annual well check will depend on your age, overall health, lifestyle risk factors, and family history of disease. Counseling  Your health care provider may ask you questions about your: Alcohol use. Tobacco use. Drug use. Emotional well-being. Home and relationship well-being. Sexual activity. Eating habits. History of falls. Memory and ability to understand (cognition). Work and work Statistician. Reproductive health. Screening  You may have the following tests or measurements: Height, weight, and BMI. Blood pressure. Lipid and cholesterol levels. These may be checked every 5 years, or more frequently if you are over 38 years old. Skin check. Lung cancer screening. You may have this screening every year starting at age 69 if you have a 30-pack-year history of smoking and currently smoke or have quit within the past 15 years. Fecal occult blood test (FOBT) of the stool. You may have this test every year starting at age 46. Flexible sigmoidoscopy or colonoscopy. You may have a sigmoidoscopy every 5 years  or a colonoscopy every 10 years starting at age 23. Hepatitis C blood test. Hepatitis B blood test. Sexually transmitted disease (STD) testing. Diabetes screening. This is done by checking your blood sugar (glucose) after you have not eaten for a while (fasting). You may have this done every 1-3 years. Bone density scan. This is done to screen for osteoporosis. You may have this done starting at age 19. Mammogram. This may be done every  1-2 years. Talk to your health care provider about how often you should have regular mammograms. Talk with your health care provider about your test results, treatment options, and if necessary, the need for more tests. Vaccines  Your health care provider may recommend certain vaccines, such as: Influenza vaccine. This is recommended every year. Tetanus, diphtheria, and acellular pertussis (Tdap, Td) vaccine. You may need a Td booster every 10 years. Zoster vaccine. You may need this after age 6. Pneumococcal 13-valent conjugate (PCV13) vaccine. One dose is recommended after age 14. Pneumococcal polysaccharide (PPSV23) vaccine. One dose is recommended after age 30. Talk to your health care provider about which screenings and vaccines you need and how often you need them. This information is not intended to replace advice given to you by your health care provider. Make sure you discuss any questions you have with your health care provider. Document Released: 12/30/2015 Document Revised: 08/22/2016 Document Reviewed: 10/04/2015 Elsevier Interactive Patient Education  2017 [place] Prevention in the Home Falls can cause injuries. They can happen to people of all ages. There are many things you can do to make your home safe and to help prevent falls. What can I do on the outside of my home? Regularly fix the edges of walkways and driveways and fix any cracks. Remove anything that might make you trip as you walk through a door, such as a raised step or threshold. Trim any bushes or trees on the path to your home. Use bright outdoor lighting. Clear any walking paths of anything that might make someone trip, such as rocks or tools. Regularly check to see if handrails are loose or broken. Make sure that both sides of any steps have handrails. Any raised decks and porches should have guardrails on the edges. Have any leaves, snow, or ice cleared regularly. Use sand or salt on walking  paths during winter. Clean up any spills in your garage right away. This includes oil or grease spills. What can I do in the bathroom? Use night lights. Install grab bars by the toilet and in the tub and shower. Do not use towel bars as grab bars. Use non-skid mats or decals in the tub or shower. If you need to sit down in the shower, use a plastic, non-slip stool. Keep the floor dry. Clean up any water that spills on the floor as soon as it happens. Remove soap buildup in the tub or shower regularly. Attach bath mats securely with double-sided non-slip rug tape. Do not have throw rugs and other things on the floor that can make you trip. What can I do in the bedroom? Use night lights. Make sure that you have a light by your bed that is easy to reach. Do not use any sheets or blankets that are too big for your bed. They should not hang down onto the floor. Have a firm chair that has side arms. You can use this for support while you get dressed. Do not have throw rugs and other things on the floor  that can make you trip. What can I do in the kitchen? Clean up any spills right away. Avoid walking on wet floors. Keep items that you use a lot in easy-to-reach places. If you need to reach something above you, use a strong step stool that has a grab bar. Keep electrical cords out of the way. Do not use floor polish or wax that makes floors slippery. If you must use wax, use non-skid floor wax. Do not have throw rugs and other things on the floor that can make you trip. What can I do with my stairs? Do not leave any items on the stairs. Make sure that there are handrails on both sides of the stairs and use them. Fix handrails that are broken or loose. Make sure that handrails are as long as the stairways. Check any carpeting to make sure that it is firmly attached to the stairs. Fix any carpet that is loose or worn. Avoid having throw rugs at the top or bottom of the stairs. If you do have  throw rugs, attach them to the floor with carpet tape. Make sure that you have a light switch at the top of the stairs and the bottom of the stairs. If you do not have them, ask someone to add them for you. What else can I do to help prevent falls? Wear shoes that: Do not have high heels. Have rubber bottoms. Are comfortable and fit you well. Are closed at the toe. Do not wear sandals. If you use a stepladder: Make sure that it is fully opened. Do not climb a closed stepladder. Make sure that both sides of the stepladder are locked into place. Ask someone to hold it for you, if possible. Clearly mark and make sure that you can see: Any grab bars or handrails. First and last steps. Where the edge of each step is. Use tools that help you move around (mobility aids) if they are needed. These include: Canes. Walkers. Scooters. Crutches. Turn on the lights when you go into a dark area. Replace any light bulbs as soon as they burn out. Set up your furniture so you have a clear path. Avoid moving your furniture around. If any of your floors are uneven, fix them. If there are any pets around you, be aware of where they are. Review your medicines with your doctor. Some medicines can make you feel dizzy. This can increase your chance of falling. Ask your doctor what other things that you can do to help prevent falls. This information is not intended to replace advice given to you by your health care provider. Make sure you discuss any questions you have with your health care provider. Document Released: 09/29/2009 Document Revised: 05/10/2016 Document Reviewed: 01/07/2015 Elsevier Interactive Patient Education  2017 [employer].

## 2022-09-26 NOTE — Progress Notes (Signed)
Subjective:   Christine Cruz is a 71 y.o. female who presents for Medicare Annual (Subsequent) preventive examination.  Review of Systems    No ROS.  Medicare Wellness Virtual Visit.  Visual/audio telehealth visit, UTA vital signs.   See social history for additional risk factors.   Cardiac Risk Factors include: advanced age (>23mn, >>42women)     Objective:    Today's Vitals   09/26/22 1002  Weight: 159 lb (72.1 kg)  Height: '5\' 1"'$  (1.549 m)   Body mass index is 30.04 kg/m.     08/14/2021    1:54 PM 04/26/2021    6:46 AM 08/11/2020    1:58 PM 04/18/2020    3:41 PM 05/23/2018    1:39 PM  Advanced Directives  Does Patient Have a Medical Advance Directive? Yes No Yes Yes No  Type of AScientist, physiologicalof [place] will Living will   Does patient want to make changes to medical advance directive? No - Patient declined  No - Patient declined No - Patient declined   Copy of [place]?   No - copy requested    Would patient like information on creating a medical advance directive?  No - Patient declined   No - Patient declined    Current Medications (verified) Outpatient Encounter Medications as of 09/26/2022  Medication Sig   Ascorbic Acid (VITAMIN C PO) Take 1 tablet by mouth daily.   atorvastatin (LIPITOR) 10 MG tablet Take 1 tablet (10 mg total) by mouth daily. After 6 pm   calcium carbonate (OSCAL) 1500 (600 Ca) MG TABS tablet Take 600 mg of elemental calcium by mouth 2 (two) times daily with a meal.   Cholecalciferol (VITAMIN D-3) 125 MCG (5000 UT) TABS Take 1 tablet by mouth in the morning and at bedtime.    losartan-hydrochlorothiazide (HYZAAR) 100-25 MG tablet Take 1 tablet by mouth daily. In am D/c 100-12.5 mg qd   magnesium oxide (MAG-OX) 400 MG tablet Take 400 mg by mouth daily.   nebivolol (BYSTOLIC) 5 MG tablet Take 1 tablet (5 mg total) by mouth daily.   Omega-3 Fatty Acids (OMEGA 3 PO) Take 1 capsule by mouth 2  (two) times daily.   potassium chloride (MICRO-K) 10 MEQ CR capsule Take 2 capsules (20 mEq total) by mouth daily.   No facility-administered encounter medications on file as of 09/26/2022.    Allergies (verified) Penicillin g   History: Past Medical History:  Diagnosis Date   Diabetes mellitus without complication (HTroy    Diet Controlled   Essential familial hypercholesterolemia    History of blood transfusion    History of hypertension    Hypertension    Menopause    Osteopenia    Osteoporosis    Prediabetes    Tear of left meniscus as current injury, subsequent encounter    as of 07/17/22 f/u ortho   Past Surgical History:  Procedure Laterality Date   ABDOMINAL HYSTERECTOMY  1999   BREAST EXCISIONAL BIOPSY  1966   CATARACT EXTRACTION W/PHACO Right 01/13/2019   Procedure: CATARACT EXTRACTION PHACO AND INTRAOCULAR LENS PLACEMENT (IOC) RIGHT, vitrectomy;  Surgeon: HMarchia Meiers MD;  Location: [facility] ORS;  Service: Ophthalmology;  Laterality: Right;  [place]   CDE Fluid pack lot # [id number]   CATARACT EXTRACTION W/PHACO Left 04/26/2021   Procedure: CATARACT EXTRACTION PHACO AND INTRAOCULAR LENS PLACEMENT (IOC) LEFT DIABETIC;  Surgeon: BLeandrew Koyanagi MD;  [place] [facility]  Service: Ophthalmology;  Laterality: Left;  CDE 10.74 1:14.6 minutes 14.4%   COLONOSCOPY WITH PROPOFOL N/A 05/23/2018   Procedure: COLONOSCOPY WITH PROPOFOL;  Surgeon: Lollie Sails, MD;  [facility] END[facility];  Service: Endoscopy;  Laterality: N/A;   TONSILLECTOMY  1965   TUBAL LIGATION     Family History  Problem Relation Age of Onset   Arthritis Mother    Heart disease Mother    Stroke Mother    Hypertension Mother    Kidney disease Mother    Diabetes Mother    Breast cancer Sister 62   Diabetes Sister    Diabetes Sister    Diabetes Sister    Diabetes Sister    Social History   Socioeconomic History   Marital status: Married    Spouse name: Not on file   Number of  children: Not on file   Years of education: Not on file   Highest education level: Not on file  Occupational History   Not on file  Tobacco Use   Smoking status: Former    Packs/day: 1.00    Types: Cigarettes    Quit date: 04/27/2006    Years since quitting: 16.4   Smokeless tobacco: Never   Tobacco comments:    former smoker age [age] took break 74 year 1/2 ppd max no FH lung cancer   Vaping Use   Vaping Use: Never used  Substance and Sexual Activity   Alcohol use: Not Currently    Alcohol/week: 0.0 standard drinks of alcohol    Comment: occ   Drug use: No   Sexual activity: Not on file  Other Topics Concern   Not on file  Social History Narrative   Unemployed   2 daughters 1 in [place] another [place]    9 siblings(8 sisters  FH htn, dm, 1 brother)   Social Determinants of Health   Financial Resource Strain: Low Risk  (08/14/2021)   Overall Financial Resource Strain (CARDIA)    Difficulty of Paying Living Expenses: Not hard at all  Food Insecurity: No Food Insecurity (08/14/2021)   Hunger Vital Sign    Worried About Running Out of Food in the Last Year: Never true    [place] in the Last Year: Never true  Transportation Needs: No Transportation Needs (08/14/2021)   PRAPARE - Hydrologist (Medical): No    Lack of Transportation (Non-Medical): No  Physical Activity: Not on file  Stress: No Stress Concern Present (08/14/2021)   [place]    Feeling of Stress : Not at all  Social Connections: Unknown (08/14/2021)   Social Connection and Isolation Panel [NHANES]    Frequency of Communication with Friends and Family: Not on file    Frequency of Social Gatherings with Friends and Family: Not on file    Attends Religious Services: Not on file    Active Member of Clubs or Organizations: Not on file    Attends Archivist Meetings: Not on file    Marital  Status: Married    Tobacco Counseling Counseling given: Not Answered Tobacco comments: former smoker age [age] took break 1 year 1/2 ppd max no FH lung cancer    Clinical Intake:  Pre-visit preparation completed: Yes                       Activities of Daily Living  09/26/2022   10:06 AM  In your present state of health, do you have any difficulty performing the following activities:  Hearing? 1  Comment L ear hearing loss. Audiology deferred at this time.  Vision? 0  Difficulty concentrating or making decisions? 0  Walking or climbing stairs? 0  Dressing or bathing? 0  Doing errands, shopping? 0  Preparing Food and eating ? N  Using the Toilet? N  In the past six months, have you accidently leaked urine? N  Do you have problems with loss of bowel control? N  Managing your Medications? N  Managing your Finances? N  Housekeeping or managing your Housekeeping? N    Patient Care Team: McLean-Scocuzza, Nino Glow, MD as PCP - General (Internal Medicine)  Indicate any recent Medical Services you may have received from other than [facility] providers in the past year (date may be approximate).     Assessment:   This is a routine wellness examination for Christine Cruz.  I connected with  Christine Cruz on 09/26/22 by a audio enabled telemedicine application and verified that I am speaking with the correct person using two identifiers.  Patient Location: Home  Provider Location: Office/Clinic  I discussed the limitations of evaluation and management by telemedicine. The patient expressed understanding and agreed to proceed.   Hearing/Vision screen Hearing Screening - Comments:: Left ear hearing loss  No hearing aid worn Vision Screening - Comments:: Followed by [facility] Wears reader lenses  Cataract extraction, bilateral They have seen their ophthalmologist .   Dietary issues and exercise activities discussed: Current Exercise Habits: Home exercise  routine, Type of exercise: walking, Time (Minutes): 45, Frequency (Times/Week): 6, Weekly Exercise (Minutes/Week): 270, Intensity: Mild Regular diet Good water intake   Goals Addressed               This Visit's Progress     Patient Stated     Weight (lb) < 138 lb (62.6 kg) (pt-stated)   159 lb (72.1 kg)     I would like to lose about 15 pounds Walk 5 days weekly for exercise Take a Master class in gardening Learn a new language       Depression Screen    07/17/2022    9:46 AM 10/13/2021    2:41 PM 08/14/2021    1:37 PM 08/11/2020    1:52 PM 04/18/2020    3:40 PM 01/29/2020    8:04 AM 05/21/2018    9:49 AM  PHQ 2/9 Scores  PHQ - 2 Score 0 0 0 0 0 0 0    Fall Risk    07/17/2022    9:46 AM 10/13/2021    2:41 PM 08/14/2021    1:56 PM 08/11/2020    1:52 PM 07/20/2020    2:40 PM  [place] in the past year? 0 0 0 0 0  Number falls in past yr:  0 0 0 0  Injury with Fall?  0   0  Risk for fall due to :  No Fall Risks     Follow up  Falls evaluation completed Falls evaluation completed Falls evaluation completed Falls evaluation completed    [place]: Home free of loose throw rugs in walkways, pet beds, electrical cords, etc? Yes  Adequate lighting in your home to reduce risk of falls? Yes   ASSISTIVE DEVICES UTILIZED TO PREVENT FALLS: Life alert? No  Use of a cane, walker or w/c? No  TIMED UP AND GO: Was the test performed? No .    Cognitive Function:        09/26/2022   10:10 AM  6CIT Screen  What Year? 0 points  What month? 0 points  What time? 0 points  Count back from 20 0 points  Months in reverse 0 points  Repeat phrase 0 points  Total Score 0 points    Immunizations Immunization History  Administered Date(s) Administered   Fluad Quad(high Dose 65+) 11/19/2020, 10/13/2021   Influenza, High Dose Seasonal PF 10/01/2019   PFIZER(Purple Top)SARS-COV-2 Vaccination 01/30/2020, 02/20/2020, 09/28/2020   Pfizer  Covid-19 Vaccine Bivalent Booster 31yr & up 12/21/2021    Screening Tests Health Maintenance  Topic Date Due   MAMMOGRAM  04/12/2022   Pneumonia Vaccine 69 Years old (1 - PCV) 01/16/2023 (Originally 06/04/2018)   INFLUENZA VACCINE  03/17/2023 (Originally 07/17/2022)   HEMOGLOBIN A1C  01/17/2023   OPHTHALMOLOGY EXAM  06/01/2023   Diabetic kidney evaluation - GFR measurement  07/18/2023   Diabetic kidney evaluation - Urine ACR  07/18/2023   COLONOSCOPY (Pts [id number] coverage will need to be confirmed)  05/23/2028   DEXA SCAN  Completed   HPV VACCINES  Aged Out   FOOT EXAM  Discontinued   TETANUS/TDAP  Discontinued   COVID-19 Vaccine  Discontinued   Hepatitis C Screening  Discontinued   Zoster Vaccines- Shingrix  Discontinued   Health Maintenance Health Maintenance Due  Topic Date Due   MAMMOGRAM  04/12/2022   Mammogram- agrees to schedule with   Lung Cancer Screening: (Low Dose CT Chest recommended if Age 681-80ears, 30 pack-year currently smoking OR have quit w/in 15years.) does not qualify.   Vision Screening: Recommended annual ophthalmology exams for early detection of glaucoma and other disorders of the eye.  Dental Screening: Recommended annual dental exams for proper oral hygiene  Community Resource Referral / Chronic Care Management: CRR required this visit?  No   CCM required this visit?  No      Plan:     I have personally reviewed and noted the following in the patient's chart:   Medical and social history Use of alcohol, tobacco or illicit drugs  Current medications and supplements including opioid prescriptions. Patient is not currently taking opioid prescriptions. Functional ability and status Nutritional status Physical activity Advanced directives List of other physicians Hospitalizations, surgeries, and ER visits in previous 12 months Vitals Screenings to include cognitive, depression, and falls Referrals and appointments  In  addition, I have reviewed and discussed with patient certain preventive protocols, quality metrics, and best practice recommendations. A written personalized care plan for preventive services as well as general preventive health recommendations were provided to patient.     OBVarney BilesLPN   [phone]

## 2022-10-25 ENCOUNTER — Ambulatory Visit
Admission: RE | Admit: 2022-10-25 | Discharge: 2022-10-25 | Disposition: A | Discharge status: Home / Self Care | Payer: MEDICARE | Source: Ambulatory Visit | Attending: Internal Medicine | Admitting: Internal Medicine

## 2022-10-25 DIAGNOSIS — Z1231 Encounter for screening mammogram for malignant neoplasm of breast: Secondary | ICD-10-CM | POA: Insufficient documentation

## 2022-12-14 DIAGNOSIS — H40053 Ocular hypertension, bilateral: Secondary | ICD-10-CM | POA: Diagnosis not present

## 2022-12-20 DIAGNOSIS — Z961 Presence of intraocular lens: Secondary | ICD-10-CM | POA: Diagnosis not present

## 2022-12-20 DIAGNOSIS — H40053 Ocular hypertension, bilateral: Secondary | ICD-10-CM | POA: Diagnosis not present

## 2022-12-20 LAB — HM DIABETES EYE EXAM

## 2023-01-07 DIAGNOSIS — Z23 Encounter for immunization: Secondary | ICD-10-CM | POA: Diagnosis not present

## 2023-01-09 ENCOUNTER — Telehealth: Payer: Self-pay | Admitting: Internal Medicine

## 2023-01-09 ENCOUNTER — Other Ambulatory Visit: Payer: Self-pay

## 2023-01-09 DIAGNOSIS — E876 Hypokalemia: Secondary | ICD-10-CM

## 2023-01-09 MED ORDER — NEBIVOLOL HCL 5 MG PO TABS: 5.0000 mg | ORAL_TABLET | Freq: Every day | ORAL | 0 refills | Status: DC

## 2023-01-09 MED ORDER — POTASSIUM CHLORIDE ER 10 MEQ PO CPCR: 20.0000 meq | ORAL_CAPSULE | Freq: Every day | ORAL | 0 refills | Status: DC

## 2023-01-09 MED ORDER — LOSARTAN POTASSIUM-HCTZ 100-25 MG PO TABS: 1.0000 | ORAL_TABLET | Freq: Every day | ORAL | 0 refills | Status: DC

## 2023-01-09 NOTE — Telephone Encounter (Signed)
Pt need a refill on potassium chloride, losartan-hydrochlorothiazide and  nebivolol sent [facility] [place]. Pt would like to be called

## 2023-01-09 NOTE — Telephone Encounter (Signed)
Called and spoke with pt pt just needed [facility] as [facility]270 I sent her 3 month supple to cover until [facility] with Volanda Napoleon in march

## 2023-01-30 DIAGNOSIS — K5730 Diverticulosis of large intestine without perforation or abscess without bleeding: Secondary | ICD-10-CM | POA: Diagnosis not present

## 2023-01-30 DIAGNOSIS — D124 Benign neoplasm of descending colon: Secondary | ICD-10-CM | POA: Diagnosis not present

## 2023-01-30 DIAGNOSIS — K644 Residual hemorrhoidal skin tags: Secondary | ICD-10-CM | POA: Diagnosis not present

## 2023-01-30 DIAGNOSIS — K635 Polyp of colon: Secondary | ICD-10-CM | POA: Diagnosis not present

## 2023-01-30 DIAGNOSIS — K640 First degree hemorrhoids: Secondary | ICD-10-CM

## 2023-01-30 DIAGNOSIS — Z86010 Personal history of colonic polyps: Secondary | ICD-10-CM | POA: Diagnosis not present

## 2023-01-30 DIAGNOSIS — D125 Benign neoplasm of sigmoid colon: Secondary | ICD-10-CM | POA: Diagnosis not present

## 2023-01-30 DIAGNOSIS — Z1211 Encounter for screening for malignant neoplasm of colon: Secondary | ICD-10-CM | POA: Diagnosis not present

## 2023-02-17 NOTE — Assessment & Plan Note (Signed)
Chronic.  Diet controlled.  Last A1c 6.3. Repeat A1c today On statin and ARB Foot exam overdue.  Schedule for next visit Diabetic eye exam due 6/24.  Referral sent Urine ACR at next visit

## 2023-02-17 NOTE — Patient Instructions (Incomplete)
It was a pleasure meeting you today. Thank you for allowing me to take part in your health care.  Our goals for today as we discussed include:  Please schedule appointment for diabetic foot exam.  This is due annually.  You should pay attention to your hemoglobin A1C.  It is a three month test about your average blood sugar. If the A1C is - <7.0 is great.  That is our goal for treating you. - Between 7.0 and 9.0 is not so good.  We would need to work to do better. - Above 9.0 is terrible.  You would really need to work with [place] to get it under control.    Today's A1C = 6.5  Continue current diet.   Heart monitor ordered for evaluation of extra heart beats    Recommend pneumonia 20 vaccine.  One-time vaccination against pneumonia.  Recommend Tetanus Vaccination.  This is given every 10 years.   You are due for an eye exam.  Please make sure that you call your eye doctor to have this scheduled and have them fax the results to our office.   If you have any questions or concerns, please do not hesitate to call the office at [phone].  I look forward to our next visit and until then take care and stay safe.  Regards,   Carollee Leitz, MD   [facility]

## 2023-02-17 NOTE — Progress Notes (Unsigned)
   SUBJECTIVE:  No chief complaint on file.  HPI Patient presents to clinic for transfer of care.  No acute concerns today.  Hypertension Asymptomatic.  Compliant with current medications.  Takes Hyzaar 100-25 mg daily, amlodipine 5 mg daily and nebivolol 5 g daily.  Tolerating medication well.  Diabetes type 2 Asymptomatic.  Diet controlled.  On statin and ARB therapy.  Hyperlipidemia On atorvastatin 10 mg daily.  Tolerating well and no myalgias.     PERTINENT PMH / PSH: Hypertension Type 2 diabetes Hearing loss, left tinnitus   OBJECTIVE:  There were no vitals taken for this visit.   Physical Exam  ASSESSMENT/PLAN:  Type 2 diabetes mellitus with hyperglycemia, without long-term current use of insulin ([place])  Hypertension associated with diabetes ([place])  Hyperlipidemia associated with type 2 diabetes mellitus ([place])   PDMP reviewed***  No follow-ups on file.  Carollee Leitz, MD

## 2023-02-18 ENCOUNTER — Encounter: Payer: Self-pay | Admitting: Family Medicine

## 2023-02-18 ENCOUNTER — Ambulatory Visit (INDEPENDENT_AMBULATORY_CARE_PROVIDER_SITE_OTHER): Payer: MEDICARE | Admitting: Family Medicine

## 2023-02-18 ENCOUNTER — Ambulatory Visit: Payer: MEDICARE | Attending: Family Medicine

## 2023-02-18 ENCOUNTER — Encounter: Payer: MEDICARE | Admitting: Family Medicine

## 2023-02-18 VITALS — BP 120/78 | HR 65 | Temp 97.5°F | Ht 61.0 in | Wt 158.4 lb

## 2023-02-18 DIAGNOSIS — E1165 Type 2 diabetes mellitus with hyperglycemia: Secondary | ICD-10-CM | POA: Diagnosis not present

## 2023-02-18 DIAGNOSIS — I499 Cardiac arrhythmia, unspecified: Secondary | ICD-10-CM

## 2023-02-18 DIAGNOSIS — I491 Atrial premature depolarization: Secondary | ICD-10-CM | POA: Diagnosis not present

## 2023-02-18 DIAGNOSIS — E785 Hyperlipidemia, unspecified: Secondary | ICD-10-CM | POA: Diagnosis not present

## 2023-02-18 DIAGNOSIS — E1159 Type 2 diabetes mellitus with other circulatory complications: Secondary | ICD-10-CM

## 2023-02-18 DIAGNOSIS — I152 Hypertension secondary to endocrine disorders: Secondary | ICD-10-CM | POA: Diagnosis not present

## 2023-02-18 DIAGNOSIS — Z1231 Encounter for screening mammogram for malignant neoplasm of breast: Secondary | ICD-10-CM

## 2023-02-18 DIAGNOSIS — E1169 Type 2 diabetes mellitus with other specified complication: Secondary | ICD-10-CM

## 2023-02-18 LAB — POCT GLYCOSYLATED HEMOGLOBIN (HGB A1C): Hemoglobin A1C: 6.5 % — AB (ref 4.0–5.6)

## 2023-02-18 LAB — BASIC METABOLIC PANEL
BUN: 15 mg/dL (ref 6–23)
CO2: 30 mEq/L (ref 19–32)
Calcium: 10.3 mg/dL (ref 8.4–10.5)
Chloride: 100 mEq/L (ref 96–112)
Creatinine, Ser: 0.86 mg/dL (ref 0.40–1.20)
GFR: 68.79 mL/min (ref >60.00)
Glucose, Bld: 152 mg/dL — ABNORMAL HIGH (ref 70–99)
Potassium: 3.7 mEq/L (ref 3.5–5.1)
Sodium: 139 mEq/L (ref 135–145)

## 2023-02-18 LAB — TSH: TSH: 1.72 u[IU]/mL (ref 0.35–5.50)

## 2023-02-18 LAB — MAGNESIUM: Magnesium: 1.8 mg/dL (ref 1.5–2.5)

## 2023-02-18 NOTE — Assessment & Plan Note (Signed)
Chronic.  Stable.  Well-controlled on current medication. Continue Hyzaar 100-25 mg daily Continue nebivolol 5 mg daily Continue Micro-K 10 mill equivalents 2 capsules daily Monitor blood pressure at home, goal less than 130/80.

## 2023-02-18 NOTE — Assessment & Plan Note (Signed)
New problem.  Controlled heart rate.  Asymptomatic.  Currently on AVN.  Noted on exam today.. EKG: there are no previous tracings available for comparison, sinus bradycardia, PAC's noted. Check TSH, CMET and magnesium Zio monitor x 2 weeks. Continue nebivolol 5 mg daily Strict return precautions provided

## 2023-02-18 NOTE — Assessment & Plan Note (Signed)
Chronic.  Self discontinued Lipitor secondary to skipped heartbeats and increased sensation of feeling heartbeat. Offered calcium score, patient declined Repeat lipids at next visit

## 2023-02-21 DIAGNOSIS — I499 Cardiac arrhythmia, unspecified: Secondary | ICD-10-CM | POA: Diagnosis not present

## 2023-03-14 DIAGNOSIS — I499 Cardiac arrhythmia, unspecified: Secondary | ICD-10-CM | POA: Diagnosis not present

## 2023-06-21 DIAGNOSIS — H40053 Ocular hypertension, bilateral: Secondary | ICD-10-CM | POA: Diagnosis not present

## 2023-06-21 DIAGNOSIS — Z961 Presence of intraocular lens: Secondary | ICD-10-CM | POA: Diagnosis not present

## 2023-09-30 ENCOUNTER — Ambulatory Visit (INDEPENDENT_AMBULATORY_CARE_PROVIDER_SITE_OTHER): Payer: MEDICARE | Admitting: *Deleted

## 2023-09-30 VITALS — Ht 61.0 in | Wt 160.0 lb

## 2023-09-30 DIAGNOSIS — Z0000 Encounter for general adult medical examination without abnormal findings: Secondary | ICD-10-CM

## 2023-09-30 NOTE — Progress Notes (Signed)
Subjective:   Christine Cruz is a 72 y.o. female who presents for Medicare Annual (Subsequent) preventive examination.  Visit Complete: Virtual I connected with  [name] on 09/30/23 by a audio enabled telemedicine application and verified that I am speaking with the correct person using two identifiers.  Patient Location: Home  Provider Location: Office/Clinic  I discussed the limitations of evaluation and management by telemedicine. The patient expressed understanding and agreed to proceed.  Vital Signs: Because this visit was a virtual/telehealth visit, some criteria may be missing or patient reported. Any vitals not documented were not able to be obtained and vitals that have been documented are patient reported.  Cardiac Risk Factors include: advanced age (>27men, >16 women);diabetes mellitus;dyslipidemia;hypertension;obesity (BMI >30kg/m2);Other (see comment)     Objective:    Today's Vitals   09/30/23 0940  Weight: 160 lb (72.6 kg)  Height: 5\' 1"  (1.549 m)   Body mass index is 30.23 kg/m.     09/30/2023    9:54 AM 08/14/2021    1:54 PM 04/26/2021    6:46 AM 08/11/2020    1:58 PM 04/18/2020    3:41 PM 05/23/2018    1:39 PM  Advanced Directives  Does Patient Have a Medical Advance Directive? Yes Yes No Yes Yes No  Type of Estate agent of [place];Living will   Healthcare Power of [place];Living will Living will   Does patient want to make changes to medical advance directive?  No - Patient declined  No - Patient declined No - Patient declined   Copy of Healthcare Power of Attorney in Chart? No - copy requested   No - copy requested    Would patient like information on creating a medical advance directive?   No - Patient declined   No - Patient declined    Current Medications (verified) Outpatient Encounter Medications as of 09/30/2023  Medication Sig   Ascorbic Acid (VITAMIN C PO) Take 1 tablet by mouth daily.   calcium carbonate (OSCAL) 1500  (600 Ca) MG TABS tablet Take 600 mg of elemental calcium by mouth 2 (two) times daily with a meal.   Cholecalciferol (VITAMIN D-3) 125 MCG (5000 UT) TABS Take 1 tablet by mouth in the morning and at bedtime.    losartan-hydrochlorothiazide (HYZAAR) 100-25 MG tablet Take 1 tablet by mouth daily. In am D/c 100-12.5 mg qd   magnesium oxide (MAG-OX) 400 MG tablet Take 400 mg by mouth daily.   nebivolol (BYSTOLIC) 5 MG tablet Take 1 tablet (5 mg total) by mouth daily.   Omega-3 Fatty Acids (OMEGA 3 PO) Take 1 capsule by mouth 2 (two) times daily.   potassium chloride (MICRO-K) 10 MEQ CR capsule Take 2 capsules (20 mEq total) by mouth daily.   Na Sulfate-K Sulfate-Mg Sulf 17.5-3.13-1.6 GM/177ML SOLN Take by mouth. (Patient not taking: Reported on 09/30/2023)   No facility-administered encounter medications on file as of 09/30/2023.    Allergies (verified) Penicillin g   History: Past Medical History:  Diagnosis Date   COVID-19 07/30/2022   Diabetes mellitus without complication (HCC)    Diet Controlled   Essential familial hypercholesterolemia    Essential hypertension 04/28/2016   History of blood transfusion    History of hypertension    Hypertension    Menopause    Osteopenia    Osteoporosis    Prediabetes    Tear of left meniscus as current injury, subsequent encounter    as of 07/17/22 f/u ortho   Past  Surgical History:  Procedure Laterality Date   ABDOMINAL HYSTERECTOMY  1999   BREAST EXCISIONAL BIOPSY  1966   CATARACT EXTRACTION W/PHACO Right 01/13/2019   Procedure: CATARACT EXTRACTION PHACO AND INTRAOCULAR LENS PLACEMENT (IOC) RIGHT, vitrectomy;  Surgeon: Elliot Cousin, MD;  Location: [facility] ORS;  Service: Ophthalmology;  Laterality: Right;  [place]    CDE Fluid pack lot # [id number]   CATARACT EXTRACTION W/PHACO Left 04/26/2021   Procedure: CATARACT EXTRACTION PHACO AND INTRAOCULAR LENS PLACEMENT (IOC) LEFT DIABETIC;  Surgeon: Lockie Mola, MD;  Location: [facility];   Service: Ophthalmology;  Laterality: Left;  CDE 10.74 1:14.6 minutes 14.4%   COLONOSCOPY WITH PROPOFOL N/A 05/23/2018   Procedure: COLONOSCOPY WITH PROPOFOL;  Surgeon: Christena Deem, MD;  Location: [facility] END[facility];  Service: Endoscopy;  Laterality: N/A;   TONSILLECTOMY  1965   TUBAL LIGATION     Family History  Problem Relation Age of Onset   Arthritis Mother    Heart disease Mother    Stroke Mother    Hypertension Mother    Kidney disease Mother    Diabetes Mother    Breast cancer Sister 43   Diabetes Sister    Diabetes Sister    Diabetes Sister    Diabetes Sister    Social History   Socioeconomic History   Marital status: Married    Spouse name: Not on file   Number of children: Not on file   Years of education: Not on file   Highest education level: Not on file  Occupational History   Not on file  Tobacco Use   Smoking status: Former    Current packs/day: 0.00    Types: Cigarettes    Quit date: 04/27/2006    Years since quitting: 17.4   Smokeless tobacco: Never   Tobacco comments:    former smoker age [age] took break 15 year 1/2 ppd max no FH lung cancer   Vaping Use   Vaping status: Never Used  Substance and Sexual Activity   Alcohol use: Not Currently    Alcohol/week: 0.0 standard drinks of alcohol    Comment: occ   Drug use: No   Sexual activity: Not on file  Other Topics Concern   Not on file  Social History Narrative   Unemployed   2 daughters 1 in [place] another [place]    From [place]    9 siblings(8 sisters  FH htn, dm, 1 brother)   Social Determinants of Health   Financial Resource Strain: Low Risk  (09/30/2023)   Overall Financial Resource Strain (CARDIA)    Difficulty of Paying Living Expenses: Not hard at all  Food Insecurity: No Food Insecurity (09/30/2023)   Hunger Vital Sign    Worried About Running Out of Food in the Last Year: Never true    Ran Out of Food in the Last Year: Never true  Transportation Needs: No Transportation Needs  (09/30/2023)   PRAPARE - Administrator, Civil Service (Medical): No    Lack of Transportation (Non-Medical): No  Physical Activity: Insufficiently Active (09/30/2023)   Exercise Vital Sign    Days of Exercise per Week: 6 days    Minutes of Exercise per Session: 20 min  Stress: No Stress Concern Present (09/30/2023)   [employer] of Occupational Health - Occupational Stress Questionnaire    Feeling of Stress : Only a little  Social Connections: Moderately Integrated (09/30/2023)   Social Connection and Isolation Panel [NHANES]    Frequency of Communication with  Friends and Family: More than three times a week    Frequency of Social Gatherings with Friends and Family: Once a week    Attends Religious Services: Never    Database administrator or Organizations: Yes    Attends Engineer, structural: More than 4 times per year    Marital Status: Married    Tobacco Counseling Counseling given: Not Answered Tobacco comments: former smoker age [age] took break 15 year 1/2 ppd max no FH lung cancer    Clinical Intake:  Pre-visit preparation completed: Yes  Pain : No/denies pain     BMI - recorded: 30.23 Nutritional Status: BMI > 30  Obese Nutritional Risks: None Diabetes: Yes CBG done?: No Did pt. bring in CBG monitor from home?: No  How often do you need to have someone help you when you read instructions, pamphlets, or other written materials from your doctor or pharmacy?: 1 - Never  Interpreter Needed?: No  Information entered by :: R. Brin Ruggerio LPN   Activities of Daily Living    09/30/2023    9:42 AM  In your present state of health, do you have any difficulty performing the following activities:  Hearing? 1  Comment lose in left ear  Vision? 0  Comment readers  Difficulty concentrating or making decisions? 0  Walking or climbing stairs? 0  Dressing or bathing? 0  Doing errands, shopping? 0  Preparing Food and eating ? N  Using the Toilet?  N  In the past six months, have you accidently leaked urine? N  Do you have problems with loss of bowel control? N  Managing your Medications? N  Managing your Finances? N  Housekeeping or managing your Housekeeping? N    Patient Care Team: Dana Allan, MD as PCP - General (Family Medicine)  Indicate any recent Medical Services you may have received from other than [facility] providers in the past year (date may be approximate).     Assessment:   This is a routine wellness examination for Christine Cruz.  Hearing/Vision screen Hearing Screening - Comments:: Some hearing loss Vision Screening - Comments:: readers   Goals Addressed             This Visit's Progress    Patient Stated       Wants to eat more from the garden and not from the store, eat more raw food       Depression Screen    09/30/2023    9:48 AM 02/18/2023    9:48 AM 07/17/2022    9:46 AM 10/13/2021    2:41 PM 08/14/2021    1:37 PM 08/11/2020    1:52 PM 04/18/2020    3:40 PM  PHQ 2/9 Scores  PHQ - 2 Score 0 0 0 0 0 0 0  PHQ- 9 Score 1          Fall Risk    09/30/2023    9:44 AM 02/18/2023    9:47 AM 07/17/2022    9:46 AM 10/13/2021    2:41 PM 08/14/2021    1:56 PM  Fall Risk   Falls in the past year? 0 0 0 0 0  Number falls in past yr: 0 0  0 0  Injury with Fall? 0 0  0   Risk for fall due to : No Fall Risks No Fall Risks  No Fall Risks   Follow up Falls prevention discussed;Falls evaluation completed Falls evaluation completed  Falls evaluation completed Falls evaluation completed  MEDICARE RISK AT HOME: Medicare Risk at Home Any stairs in or around the home?: Yes If so, are there any without handrails?: No Home free of loose throw rugs in walkways, pet beds, electrical cords, etc?: Yes Adequate lighting in your home to reduce risk of falls?: Yes Life alert?: No Use of a cane, walker or w/c?: No Grab bars in the bathroom?: No Shower chair or bench in shower?: No Elevated toilet seat or a handicapped  toilet?: No   Cognitive Function:        09/30/2023    9:54 AM 09/26/2022   10:10 AM  6CIT Screen  What Year? 0 points 0 points  What month? 0 points 0 points  What time? 0 points 0 points  Count back from 20 0 points 0 points  Months in reverse 0 points 0 points  Repeat phrase 0 points 0 points  Total Score 0 points 0 points    Immunizations Immunization History  Administered Date(s) Administered   Fluad Quad(high Dose 65+) 11/19/2020, 10/13/2021   Influenza, High Dose Seasonal PF 10/01/2019   PFIZER(Purple Top)SARS-COV-2 Vaccination 01/30/2020, 02/20/2020, 09/28/2020   Pfizer Covid-19 Vaccine Bivalent Booster 29yrs & up 12/21/2021   Pfizer(Comirnaty)Fall Seasonal Vaccine 12 years and older 01/07/2023    TDAP status: Due, Education has been provided regarding the importance of this vaccine. Advised may receive this vaccine at local pharmacy or Health Dept. Aware to provide a copy of the vaccination record if obtained from local pharmacy or Health Dept. Verbalized acceptance and understanding.  Flu Vaccine status: Due, Education has been provided regarding the importance of this vaccine. Advised may receive this vaccine at local pharmacy or Health Dept. Aware to provide a copy of the vaccination record if obtained from local pharmacy or Health Dept. Verbalized acceptance and understanding.  Pneumococcal vaccine status: Due, Education has been provided regarding the importance of this vaccine. Advised may receive this vaccine at local pharmacy or Health Dept. Aware to provide a copy of the vaccination record if obtained from local pharmacy or Health Dept. Verbalized acceptance and understanding.  Covid-19 vaccine status: Information provided on how to obtain vaccines.   Qualifies for Shingles Vaccine? Yes   Zostavax completed No   Shingrix Completed?: No.    Education has been provided regarding the importance of this vaccine. Patient has been advised to call insurance company  to determine out of pocket expense if they have not yet received this vaccine. Advised may also receive vaccine at local pharmacy or Health Dept. Verbalized acceptance and understanding.  Screening Tests Health Maintenance  Topic Date Due   DTaP/Tdap/Td (1 - Tdap) Never done   Lung Cancer Screening  Never done   Pneumonia Vaccine 63+ Years old (1 of 1 - PCV) Never done   Diabetic kidney evaluation - Urine ACR  07/18/2023   INFLUENZA VACCINE  07/18/2023   HEMOGLOBIN A1C  08/21/2023   Medicare Annual Wellness (AWV)  09/27/2023   OPHTHALMOLOGY EXAM  12/21/2023   Diabetic kidney evaluation - eGFR measurement  02/18/2024   MAMMOGRAM  10/25/2024   Colonoscopy  02/13/2033   DEXA SCAN  Completed   HPV VACCINES  Aged Out   FOOT EXAM  Discontinued   COVID-19 Vaccine  Discontinued   Hepatitis C Screening  Discontinued   Zoster Vaccines- Shingrix  Discontinued    Health Maintenance  Health Maintenance Due  Topic Date Due   DTaP/Tdap/Td (1 - Tdap) Never done   Lung Cancer Screening  Never done  Pneumonia Vaccine 49+ Years old (1 of 1 - PCV) Never done   Diabetic kidney evaluation - Urine ACR  07/18/2023   INFLUENZA VACCINE  07/18/2023   HEMOGLOBIN A1C  08/21/2023   Medicare Annual Wellness (AWV)  09/27/2023    Colorectal cancer screening: Type of screening: Colonoscopy. Completed 01/2023. Repeat every 5 years  Mammogram status: Completed 10/2022. Repeat every year Order has been placed  Bone Density status: Completed 03/2020. Results reflect: Bone density results: OSTEOPENIA. Repeat every 2 years. Wants to discuss with PCP at next visit  Lung Cancer Screening: (Low Dose CT Chest recommended if Age 26-80 years, 20 pack-year currently smoking OR have quit w/in 15years.) does not qualify. Per patient quit over 15 years ago    Additional Screening:  Hepatitis C Screening: does qualify; Completed Shows patient declined  Vision Screening: Recommended annual ophthalmology exams for  early detection of glaucoma and other disorders of the eye. Is the patient up to date with their annual eye exam?  Yes  Who is the provider or what is the name of the office in which the patient attends annual eye exams? [facility] If pt is not established with a provider, would they like to be referred to a provider to establish care? No .   Dental Screening: Recommended annual dental exams for proper oral hygiene  Diabetic Foot Exam: Diabetic Foot Exam: Completed 07/2020  Community Resource Referral / Chronic Care Management: CRR required this visit?  No   CCM required this visit?  No     Plan:     I have personally reviewed and noted the following in the patient's chart:   Medical and social history Use of alcohol, tobacco or illicit drugs  Current medications and supplements including opioid prescriptions. Patient is not currently taking opioid prescriptions. Functional ability and status Nutritional status Physical activity Advanced directives List of other physicians Hospitalizations, surgeries, and ER visits in previous 12 months Vitals Screenings to include cognitive, depression, and falls Referrals and appointments  In addition, I have reviewed and discussed with patient certain preventive protocols, quality metrics, and best practice recommendations. A written personalized care plan for preventive services as well as general preventive health recommendations were provided to patient.     Sydell Axon, LPN   [id number]   After Visit Summary: (MyChart) Due to this being a telephonic visit, the after visit summary with patients personalized plan was offered to patient via MyChart   Nurse Notes: See routing comments

## 2023-09-30 NOTE — Patient Instructions (Signed)
Ms. Hirschi , Thank you for taking time to come for your Medicare Wellness Visit. I appreciate your ongoing commitment to your health goals. Please review the following plan we discussed and let me know if I can assist you in the future.   Referrals/Orders/Follow-Ups/Clinician Recommendations: Consider getting your vaccines updated  This is a list of the screening recommended for you and due dates:  Health Maintenance  Topic Date Due   DTaP/Tdap/Td vaccine (1 - Tdap) Never done   Screening for Lung Cancer  Never done   Pneumonia Vaccine (1 of 1 - PCV) Never done   Yearly kidney health urinalysis for diabetes  07/18/2023   Flu Shot  07/18/2023   Hemoglobin A1C  08/21/2023   Eye exam for diabetics  12/21/2023   Yearly kidney function blood test for diabetes  02/18/2024   Medicare Annual Wellness Visit  09/29/2024   Mammogram  10/25/2024   Colon Cancer Screening  02/14/2028   DEXA scan (bone density measurement)  Completed   HPV Vaccine  Aged Out   Complete foot exam   Discontinued   COVID-19 Vaccine  Discontinued   Hepatitis C Screening  Discontinued   Zoster (Shingles) Vaccine  Discontinued    Advanced directives: (Copy Requested) Please bring a copy of your health care power of attorney and living will to the office to be added to your chart at your convenience.  Next Medicare Annual Wellness Visit scheduled for next year: Yes 09/30/24 @ 10:10

## 2023-10-01 NOTE — Progress Notes (Signed)
Left message to return call to our office.  Pt need an appointment for CPE with Dr. Clent Ridges.

## 2023-10-08 ENCOUNTER — Telehealth: Payer: Self-pay | Admitting: Family Medicine

## 2023-10-08 ENCOUNTER — Other Ambulatory Visit: Payer: Self-pay

## 2023-10-08 DIAGNOSIS — E876 Hypokalemia: Secondary | ICD-10-CM

## 2023-10-08 NOTE — Telephone Encounter (Signed)
Patient called and would like a med refill on these meds below potassium chloride (MICRO-K) 10 MEQ CR capsule  losartan-hydrochlorothiazide (HYZAAR) 100-25 MG tablet    nebivolol (BYSTOLIC) 5 MG tablet  Please send to [facility] on [address]  LOV 02/18/23

## 2023-10-08 NOTE — Telephone Encounter (Signed)
Schedule pt lab and office appointment pend the rx request.

## 2023-10-11 MED ORDER — NEBIVOLOL HCL 5 MG PO TABS: 5.0000 mg | ORAL_TABLET | Freq: Every day | ORAL | 0 refills | Status: DC

## 2023-10-11 MED ORDER — POTASSIUM CHLORIDE ER 10 MEQ PO CPCR: 20.0000 meq | ORAL_CAPSULE | Freq: Every day | ORAL | 0 refills | Status: DC

## 2023-10-11 MED ORDER — LOSARTAN POTASSIUM-HCTZ 100-25 MG PO TABS: 1.0000 | ORAL_TABLET | Freq: Every day | ORAL | 0 refills | Status: DC

## 2023-10-11 NOTE — Telephone Encounter (Signed)
Patient called for status on med refill. Lab and office appt have been made.

## 2023-10-11 NOTE — Telephone Encounter (Signed)
Called pt and advised her that her medication was sent to pharmacy.

## 2023-10-17 ENCOUNTER — Other Ambulatory Visit: Payer: MEDICARE

## 2023-10-21 ENCOUNTER — Other Ambulatory Visit (INDEPENDENT_AMBULATORY_CARE_PROVIDER_SITE_OTHER): Payer: MEDICARE

## 2023-10-21 DIAGNOSIS — I152 Hypertension secondary to endocrine disorders: Secondary | ICD-10-CM | POA: Diagnosis not present

## 2023-10-21 DIAGNOSIS — E1159 Type 2 diabetes mellitus with other circulatory complications: Secondary | ICD-10-CM

## 2023-10-21 DIAGNOSIS — E785 Hyperlipidemia, unspecified: Secondary | ICD-10-CM

## 2023-10-21 DIAGNOSIS — E1165 Type 2 diabetes mellitus with hyperglycemia: Secondary | ICD-10-CM | POA: Diagnosis not present

## 2023-10-21 DIAGNOSIS — E1169 Type 2 diabetes mellitus with other specified complication: Secondary | ICD-10-CM | POA: Diagnosis not present

## 2023-10-21 LAB — COMPREHENSIVE METABOLIC PANEL
ALT: 14 U/L (ref 0–35)
AST: 17 U/L (ref 0–37)
Albumin: 4.4 g/dL (ref 3.5–5.2)
Alkaline Phosphatase: 45 U/L (ref 39–117)
BUN: 12 mg/dL (ref 6–23)
CO2: 31 meq/L (ref 19–32)
Calcium: 9.6 mg/dL (ref 8.4–10.5)
Chloride: 100 meq/L (ref 96–112)
Creatinine, Ser: 0.9 mg/dL (ref 0.40–1.20)
GFR: 64.83 mL/min (ref >60.00)
Glucose, Bld: 144 mg/dL — ABNORMAL HIGH (ref 70–99)
Potassium: 3.8 meq/L (ref 3.5–5.1)
Sodium: 139 meq/L (ref 135–145)
Total Bilirubin: 0.8 mg/dL (ref 0.2–1.2)
Total Protein: 7 g/dL (ref 6.0–8.3)

## 2023-10-21 LAB — CBC WITH DIFFERENTIAL/PLATELET
Basophils Absolute: 0 10*3/uL (ref 0.0–0.1)
Basophils Relative: 0.3 % (ref 0.0–3.0)
Eosinophils Absolute: 0.1 10*3/uL (ref 0.0–0.7)
Eosinophils Relative: 2.4 % (ref 0.0–5.0)
HCT: 41.9 % (ref 36.0–46.0)
Hemoglobin: 13.5 g/dL (ref 12.0–15.0)
Lymphocytes Relative: 31.5 % (ref 12.0–46.0)
Lymphs Abs: 1.7 10*3/uL (ref 0.7–4.0)
MCHC: 32.2 g/dL (ref 30.0–36.0)
MCV: 95.3 fL (ref 78.0–100.0)
Monocytes Absolute: 0.5 10*3/uL (ref 0.1–1.0)
Monocytes Relative: 8.6 % (ref 3.0–12.0)
Neutro Abs: 3 10*3/uL (ref 1.4–7.7)
Neutrophils Relative %: 57.2 % (ref 43.0–77.0)
Platelets: 144 10*3/uL — ABNORMAL LOW (ref 150.0–400.0)
RBC: 4.4 Mil/uL (ref 3.87–5.11)
RDW: 13.5 % (ref 11.5–15.5)
WBC: 5.3 10*3/uL (ref 4.0–10.5)

## 2023-10-21 LAB — LIPID PANEL
Cholesterol: 240 mg/dL — ABNORMAL HIGH (ref 0–200)
HDL: 70.9 mg/dL (ref >39.00)
LDL Cholesterol: 147 mg/dL — ABNORMAL HIGH (ref 0–99)
NonHDL: 169.02
Total CHOL/HDL Ratio: 3
Triglycerides: 111 mg/dL (ref 0.0–149.0)
VLDL: 22.2 mg/dL (ref 0.0–40.0)

## 2023-10-21 LAB — MICROALBUMIN / CREATININE URINE RATIO
Creatinine,U: 60.5 mg/dL
Microalb Creat Ratio: 1.2 mg/g (ref 0.0–30.0)
Microalb, Ur: <0.7 mg/dL (ref 0.0–1.9)

## 2023-10-26 ENCOUNTER — Encounter: Payer: Self-pay | Admitting: Family Medicine

## 2023-10-28 ENCOUNTER — Encounter: Payer: Self-pay | Admitting: Family Medicine

## 2023-10-28 ENCOUNTER — Ambulatory Visit: Payer: MEDICARE | Admitting: Family Medicine

## 2023-10-28 VITALS — BP 150/70 | HR 60 | Temp 98.0°F | Resp 16 | Ht 61.0 in | Wt 164.8 lb

## 2023-10-28 DIAGNOSIS — E118 Type 2 diabetes mellitus with unspecified complications: Secondary | ICD-10-CM

## 2023-10-28 DIAGNOSIS — Z7984 Long term (current) use of oral hypoglycemic drugs: Secondary | ICD-10-CM | POA: Diagnosis not present

## 2023-10-28 DIAGNOSIS — E785 Hyperlipidemia, unspecified: Secondary | ICD-10-CM

## 2023-10-28 DIAGNOSIS — E1169 Type 2 diabetes mellitus with other specified complication: Secondary | ICD-10-CM

## 2023-10-28 DIAGNOSIS — D696 Thrombocytopenia, unspecified: Secondary | ICD-10-CM

## 2023-10-28 DIAGNOSIS — I152 Hypertension secondary to endocrine disorders: Secondary | ICD-10-CM | POA: Diagnosis not present

## 2023-10-28 DIAGNOSIS — M778 Other enthesopathies, not elsewhere classified: Secondary | ICD-10-CM

## 2023-10-28 DIAGNOSIS — E1159 Type 2 diabetes mellitus with other circulatory complications: Secondary | ICD-10-CM | POA: Diagnosis not present

## 2023-10-28 DIAGNOSIS — Z23 Encounter for immunization: Secondary | ICD-10-CM

## 2023-10-28 LAB — POCT GLYCOSYLATED HEMOGLOBIN (HGB A1C): Hemoglobin A1C: 6.4 % — AB (ref 4.0–5.6)

## 2023-10-28 MED ORDER — ROSUVASTATIN CALCIUM 20 MG PO TABS: 20.0000 mg | ORAL_TABLET | Freq: Every day | ORAL | 3 refills | Status: DC

## 2023-10-28 NOTE — Patient Instructions (Addendum)
It was a pleasure meeting you today. Thank you for allowing me to take part in your health care.  Our goals for today as we discussed include:  Flu vaccine today  Start Crestor 20 mg daily.  Monitor for any increase in muscle aches, joint pain or weakness.  This is a list of the screening recommended for you and due dates:  Health Maintenance  Topic Date Due   DTaP/Tdap/Td vaccine (1 - Tdap) Never done   Pneumonia Vaccine (1 of 1 - PCV) Never done   Flu Shot  07/18/2023   Eye exam for diabetics  12/21/2023   Hemoglobin A1C  04/26/2024   Medicare Annual Wellness Visit  09/29/2024   Yearly kidney function blood test for diabetes  10/20/2024   Yearly kidney health urinalysis for diabetes  10/20/2024   Mammogram  10/25/2024   Colon Cancer Screening  02/14/2028   DEXA scan (bone density measurement)  Completed   HPV Vaccine  Aged Out   Screening for Lung Cancer  Discontinued   Complete foot exam   Discontinued   COVID-19 Vaccine  Discontinued   Hepatitis C Screening  Discontinued   Zoster (Shingles) Vaccine  Discontinued     Follow up as needed   If you have any questions or concerns, please do not hesitate to call the office at [phone].  I look forward to our next visit and until then take care and stay safe.  Regards,   Dana Allan, MD   [facility]

## 2023-10-28 NOTE — Progress Notes (Unsigned)
SUBJECTIVE:   Chief Complaint  Patient presents with  . Diabetes   HPI ***  PERTINENT PMH / PSH: ***  OBJECTIVE:  BP (!) 150/70   Pulse 60   Temp 98 F (36.7 C)   Resp 16   Ht 5\' 1"  (1.549 m)   Wt 164 lb 12 oz (74.7 kg)   SpO2 98%   BMI 31.13 kg/m    Physical Exam Vitals reviewed.  Constitutional:      General: She is not in acute distress.    Appearance: She is not ill-appearing.  HENT:     Head: Normocephalic.     Right Ear: Tympanic membrane, ear canal and external ear normal.     Left Ear: Tympanic membrane, ear canal and external ear normal.     Nose: Nose normal.     Mouth/Throat:     Mouth: Mucous membranes are moist.  Eyes:     Extraocular Movements: Extraocular movements intact.     Conjunctiva/sclera: Conjunctivae normal.     Pupils: Pupils are equal, round, and reactive to light.  Neck:     Thyroid: No thyromegaly or thyroid tenderness.     Vascular: No carotid bruit.  Cardiovascular:     Rate and Rhythm: Normal rate and regular rhythm.     Pulses: Normal pulses.     Heart sounds: Normal heart sounds.  Pulmonary:     Effort: Pulmonary effort is normal.     Breath sounds: Normal breath sounds.  Abdominal:     General: Bowel sounds are normal. There is no distension.     Palpations: Abdomen is soft.     Tenderness: There is no abdominal tenderness. There is no right CVA tenderness, left CVA tenderness, guarding or rebound.  Musculoskeletal:        General: Normal range of motion.     Right forearm: Tenderness present. No swelling, edema, deformity or bony tenderness.       Arms:     Cervical back: Normal range of motion. Muscular tenderness present.     Right lower leg: No edema.     Left lower leg: No edema.  Lymphadenopathy:     Cervical: No cervical adenopathy.  Skin:    Capillary Refill: Capillary refill takes less than 2 seconds.  Neurological:     General: No focal deficit present.     Mental Status: She is alert and oriented to  person, place, and time. Mental status is at baseline.     Motor: No weakness.  Psychiatric:        Mood and Affect: Mood normal.        Behavior: Behavior normal.        Thought Content: Thought content normal.        Judgment: Judgment normal.       10/28/2023    1:07 PM 09/30/2023    9:48 AM 02/18/2023    9:48 AM 07/17/2022    9:46 AM 10/13/2021    2:41 PM  Depression screen PHQ 2/9  Decreased Interest 0 0 0 0 0  Down, Depressed, Hopeless 0 0 0 0 0  PHQ - 2 Score 0 0 0 0 0  Altered sleeping 0 0     Tired, decreased energy 0 0     Change in appetite 0 1     Feeling bad or failure about yourself  0 0     Trouble concentrating 0 0     Moving slowly or fidgety/restless 0 0  Suicidal thoughts 0 0     PHQ-9 Score 0 1     Difficult doing work/chores Not difficult at all Not difficult at all         10/28/2023    1:07 PM 02/18/2023    9:48 AM  GAD 7 : Generalized Anxiety Score  Nervous, Anxious, on Edge 0 0  Control/stop worrying 0 0  Worry too much - different things 0 0  Trouble relaxing 0 0  Restless 0 0  Easily annoyed or irritable 0 0  Afraid - awful might happen 0 0  Total GAD 7 Score 0 0  Anxiety Difficulty Not difficult at all Not difficult at all    ASSESSMENT/PLAN:  Type 2 diabetes with complication (HCC) -     POCT glycosylated hemoglobin (Hb A1C)   PDMP reviewed***  No follow-ups on file.  Dana Allan, MD

## 2023-10-31 ENCOUNTER — Encounter: Payer: Self-pay | Admitting: Family Medicine

## 2023-10-31 DIAGNOSIS — Z23 Encounter for immunization: Secondary | ICD-10-CM | POA: Insufficient documentation

## 2023-10-31 DIAGNOSIS — D696 Thrombocytopenia, unspecified: Secondary | ICD-10-CM | POA: Insufficient documentation

## 2023-10-31 DIAGNOSIS — M778 Other enthesopathies, not elsewhere classified: Secondary | ICD-10-CM | POA: Insufficient documentation

## 2023-10-31 NOTE — Assessment & Plan Note (Signed)
Total cholesterol of 240, LDL of 147, and triglycerides within normal range. Patient has a 41% 10-year risk of heart attack or stroke due to diabetes and hyperlipidemia. -Start Crestor 20mg  daily, monitor for muscle aches, pains, and weakness. -If intolerant to Crestor, consider Zetia or PCSK9 inhibitor  as alternatives. -Provide low glycemic index diet information to help lower cholesterol.

## 2023-10-31 NOTE — Assessment & Plan Note (Signed)
Platelet count improved from 120 to 144 (low normal is 150). No current concerns as platelets are trending upward. -Monitor platelet count in future blood work.

## 2023-10-31 NOTE — Assessment & Plan Note (Signed)
Patient reports right arm pain, possibly related to recent physical activity. -Recommend over-the-counter Diclofenac gel for topical pain relief. -Advise patient to ice the area and consider massage. -If pain worsens, patient to notify clinic.

## 2023-10-31 NOTE — Assessment & Plan Note (Signed)
A1c of 6.4, improved from previous level. Patient has made dietary changes and is not currently on any diabetes medications. -Continue current dietary modifications. -Consider Metformin if glucose levels increase in the future.

## 2023-10-31 NOTE — Assessment & Plan Note (Signed)
Blood pressure slightly elevated during visit, possibly due to pain. Patient is currently on blood pressure medication. -Monitor blood pressure at home and report if consistently above 140/90 over the next 1-2 weeks.

## 2023-11-04 ENCOUNTER — Ambulatory Visit
Admission: RE | Admit: 2023-11-04 | Discharge: 2023-11-04 | Disposition: A | Discharge status: Home / Self Care | Payer: MEDICARE | Source: Ambulatory Visit | Attending: Family Medicine | Admitting: Family Medicine

## 2023-11-04 DIAGNOSIS — Z1231 Encounter for screening mammogram for malignant neoplasm of breast: Secondary | ICD-10-CM | POA: Insufficient documentation

## 2023-11-06 ENCOUNTER — Telehealth: Payer: Self-pay | Admitting: Family Medicine

## 2023-11-06 NOTE — Telephone Encounter (Signed)
Pt would like to be called regarding a new BP medication that she is on.

## 2023-11-07 NOTE — Telephone Encounter (Signed)
Called pt and spoke to her about her medication. She wanted to know what year she started the dose Bp that she is on now.

## 2023-11-07 NOTE — Telephone Encounter (Signed)
Left message to return call to our office.  

## 2023-12-03 DIAGNOSIS — H43812 Vitreous degeneration, left eye: Secondary | ICD-10-CM | POA: Diagnosis not present

## 2023-12-03 DIAGNOSIS — H40053 Ocular hypertension, bilateral: Secondary | ICD-10-CM | POA: Diagnosis not present

## 2024-01-06 ENCOUNTER — Other Ambulatory Visit: Payer: Self-pay | Admitting: Family Medicine

## 2024-01-06 DIAGNOSIS — E876 Hypokalemia: Secondary | ICD-10-CM

## 2024-01-07 LAB — HM DIABETES EYE EXAM

## 2024-01-08 ENCOUNTER — Other Ambulatory Visit: Payer: Self-pay | Admitting: Family Medicine

## 2024-01-08 DIAGNOSIS — E876 Hypokalemia: Secondary | ICD-10-CM

## 2024-01-08 MED ORDER — LOSARTAN POTASSIUM-HCTZ 100-25 MG PO TABS: 1.0000 | ORAL_TABLET | Freq: Every day | ORAL | 3 refills | Status: DC

## 2024-01-08 MED ORDER — NEBIVOLOL HCL 5 MG PO TABS: 5.0000 mg | ORAL_TABLET | Freq: Every day | ORAL | 3 refills | Status: DC

## 2024-01-08 MED ORDER — POTASSIUM CHLORIDE ER 10 MEQ PO CPCR: 20.0000 meq | ORAL_CAPSULE | Freq: Every day | ORAL | 3 refills | Status: DC

## 2024-04-06 ENCOUNTER — Telehealth: Payer: Self-pay

## 2024-04-06 ENCOUNTER — Telehealth: Payer: Self-pay | Admitting: Family Medicine

## 2024-04-06 NOTE — Telephone Encounter (Signed)
 Copied from CRM [id number]. Topic: General - Other >> Apr 03, 2024  3:05 PM Star East wrote: Reason for CRM: When Dr Sena Dam patient would like to start seeing  Tona Francis.

## 2024-04-06 NOTE — Telephone Encounter (Signed)
 LMOM and sent MyChart message for pt to call office to schedule TOC.  E2C2, please schedule TOC when pt calls back. [facility]

## 2024-04-08 NOTE — Telephone Encounter (Signed)
 Noted.

## 2024-09-29 NOTE — Telephone Encounter (Signed)
 I received the following message from Angeline Fredericks, LPN, via Teams:  MRN [id number] Look like this patient does not have a TOC appointment when you get a chance can you reach out to her and get it scheduled? Thanks  I left voicemail for patient asking her to please call us  back and let us  know if she is still interested in scheduling a TOC appointment with Chelsea Aurora, NP, or if she is planning to seek primary care elsewhere.  E2C2 - when patient calls back, please assist her with scheduling a transfer of care appointment with Chelsea Aurora, NP, or with another provider of her choice, who is accepting transfers of care.  If patient is going to seek primary care elsewhere, please document this in her chart.

## 2024-11-18 ENCOUNTER — Telehealth: Payer: Self-pay | Admitting: Nurse Practitioner

## 2024-11-18 DIAGNOSIS — E1169 Type 2 diabetes mellitus with other specified complication: Secondary | ICD-10-CM

## 2024-11-18 NOTE — Telephone Encounter (Unsigned)
 Copied from CRM #[id number]. Topic: Clinical - Medication Refill >> Nov 18, 2024 10:50 AM [name] wrote: Medication: rosuvastatin  (CRESTOR ) 20 MG tablet  Has the patient contacted their pharmacy? Yes  This is the patient's preferred pharmacy:  [id number] - [place], [place] - [address] AT [facility] [address] [facility] [address] [place] [address] Phone: [phone] Fax: [phone]   Is this the correct pharmacy for this prescription? Yes If no, delete pharmacy and type the correct one.   Has the prescription been filled recently? Yes  Is the patient out of the medication? Yes  Has the patient been seen for an appointment in the last year OR does the patient have an upcoming appointment? Yes, TOC scheduled for March  Can we respond through MyChart? Yes  Agent: Please be advised that Rx refills may take up to 3 business days. We ask that you follow-up with your pharmacy.

## 2024-11-19 MED ORDER — ROSUVASTATIN CALCIUM 20 MG PO TABS: 20.0000 mg | ORAL_TABLET | Freq: Every day | ORAL | 0 refills | Status: AC

## 2024-12-15 ENCOUNTER — Telehealth: Payer: Self-pay

## 2024-12-15 DIAGNOSIS — Z1231 Encounter for screening mammogram for malignant neoplasm of breast: Secondary | ICD-10-CM

## 2024-12-15 NOTE — Telephone Encounter (Signed)
 Copied from CRM #[id number]. Topic: Referral - Question >> Dec 15, 2024 12:41 PM Aisha D wrote: Reason for CRM: Pt is requesting a referral for a mammogram and would like to have it sent to [facility] at [facility]. Pt was a former pt of Dr.Walsh and has a current TOC appt scheduled on 3/3 with Charanpreet Kaur,NP. Pt would like a callback with an update.

## 2025-01-08 LAB — OPHTHALMOLOGY REPORT-SCANNED

## 2025-01-13 ENCOUNTER — Other Ambulatory Visit: Payer: Self-pay

## 2025-01-13 ENCOUNTER — Telehealth: Payer: Self-pay

## 2025-01-13 DIAGNOSIS — E1159 Type 2 diabetes mellitus with other circulatory complications: Secondary | ICD-10-CM

## 2025-01-13 DIAGNOSIS — E1169 Type 2 diabetes mellitus with other specified complication: Secondary | ICD-10-CM

## 2025-01-13 DIAGNOSIS — E118 Type 2 diabetes mellitus with unspecified complications: Secondary | ICD-10-CM

## 2025-01-13 NOTE — Addendum Note (Signed)
 Addended by: MARYLYNN VERNEITA CROME on: 01/13/2025 04:18 PM   Modules accepted: Orders

## 2025-01-13 NOTE — Telephone Encounter (Signed)
 Labs ordered and Patient is scheduled for 01/14/25 at 2:30.

## 2025-01-13 NOTE — Addendum Note (Signed)
 Addended by: MARYLYNN VERNEITA CROME on: 01/13/2025 04:16 PM   Modules accepted: Orders

## 2025-01-13 NOTE — Telephone Encounter (Signed)
 Copied from CRM #[id number]. Topic: Clinical - Medication Refill >> Jan 12, 2025  1:39 PM Hadassah PARAS wrote: Medication: losartan -hydrochlorothiazide  (HYZAAR) 100-25 MG tablet; nebivolol  (BYSTOLIC ) 5 MG tablet;  ; potassium chloride  (MICRO-K ) 10 MEQ CR capsule    Has the patient contacted their pharmacy? Yes (Agent: If no, request that the patient contact the pharmacy for the refill. If patient does not wish to contact the pharmacy document the reason why and proceed with request.) (Agent: If yes, when and what did the pharmacy advise?)  This is the patient's preferred pharmacy:  [id number] - [place], [place] - [address] AT [facility] [address] [facility] [address] [place] [address] Phone: [phone] Fax: [phone]   Is this the correct pharmacy for this prescription? Yes If no, delete pharmacy and type the correct one.   Has the prescription been filled recently? No  Is the patient out of the medication? No  Has the patient been seen for an appointment in the last year OR does the patient have an upcoming appointment? Yes  Can we respond through MyChart? Yes  Agent: Please be advised that Rx refills may take up to 3 business days. We ask that you follow-up with your pharmacy.

## 2025-01-14 ENCOUNTER — Other Ambulatory Visit: Payer: MEDICARE

## 2025-01-14 DIAGNOSIS — E118 Type 2 diabetes mellitus with unspecified complications: Secondary | ICD-10-CM

## 2025-01-14 DIAGNOSIS — E1159 Type 2 diabetes mellitus with other circulatory complications: Secondary | ICD-10-CM

## 2025-01-14 DIAGNOSIS — I152 Hypertension secondary to endocrine disorders: Secondary | ICD-10-CM

## 2025-01-15 LAB — LIPID PANEL
Cholesterol: 143 mg/dL (ref 28–200)
HDL: 61.6 mg/dL
LDL Cholesterol: 66 mg/dL (ref 10–99)
NonHDL: 81.85
Total CHOL/HDL Ratio: 2
Triglycerides: 77 mg/dL (ref 10.0–149.0)
VLDL: 15.4 mg/dL (ref 0.0–40.0)

## 2025-01-15 LAB — COMPREHENSIVE METABOLIC PANEL WITH GFR
ALT: 18 U/L (ref 3–35)
AST: 18 U/L (ref 5–37)
Albumin: 4.5 g/dL (ref 3.5–5.2)
Alkaline Phosphatase: 51 U/L (ref 39–117)
BUN: 11 mg/dL (ref 6–23)
CO2: 30 meq/L (ref 19–32)
Calcium: 10.1 mg/dL (ref 8.4–10.5)
Chloride: 100 meq/L (ref 96–112)
Creatinine, Ser: 0.83 mg/dL (ref 0.40–1.20)
GFR: 70.83 mL/min
Glucose, Bld: 146 mg/dL — ABNORMAL HIGH (ref 70–99)
Potassium: 3.7 meq/L (ref 3.5–5.1)
Sodium: 139 meq/L (ref 135–145)
Total Bilirubin: 0.8 mg/dL (ref 0.2–1.2)
Total Protein: 6.8 g/dL (ref 6.0–8.3)

## 2025-01-15 LAB — MICROALBUMIN / CREATININE URINE RATIO
Creatinine,U: 36.3 mg/dL
Microalb Creat Ratio: UNDETERMINED mg/g (ref 0.0–30.0)
Microalb, Ur: <0.7 mg/dL

## 2025-01-15 LAB — HEMOGLOBIN A1C: Hgb A1c MFr Bld: 8 % — ABNORMAL HIGH (ref 4.6–6.5)

## 2025-01-15 LAB — MAGNESIUM: Magnesium: 1.9 mg/dL (ref 1.5–2.5)

## 2025-01-15 LAB — LDL CHOLESTEROL, DIRECT: Direct LDL: 66 mg/dL

## 2025-01-17 ENCOUNTER — Ambulatory Visit: Payer: Self-pay | Admitting: Internal Medicine

## 2025-01-17 DIAGNOSIS — E876 Hypokalemia: Secondary | ICD-10-CM

## 2025-01-17 MED ORDER — LOSARTAN POTASSIUM-HCTZ 100-25 MG PO TABS: 1.0000 | ORAL_TABLET | Freq: Every day | ORAL | 0 refills | Status: AC

## 2025-01-21 MED ORDER — NEBIVOLOL HCL 5 MG PO TABS: 5.0000 mg | ORAL_TABLET | Freq: Every day | ORAL | 0 refills | Status: AC

## 2025-01-21 MED ORDER — POTASSIUM CHLORIDE ER 10 MEQ PO CPCR: 20.0000 meq | ORAL_CAPSULE | Freq: Every day | ORAL | 0 refills | Status: AC

## 2025-01-26 ENCOUNTER — Ambulatory Visit: Payer: MEDICARE

## 2025-02-16 ENCOUNTER — Encounter: Payer: MEDICARE | Admitting: Nurse Practitioner
